# Patient Record
Sex: Male | Born: 1940
Health system: Southern US, Community
[De-identification: ages and names within clinical notes are randomized; demographics above are authoritative.]

## PROBLEM LIST (undated history)

## (undated) DIAGNOSIS — I10 Essential (primary) hypertension: Secondary | ICD-10-CM

## (undated) DIAGNOSIS — R972 Elevated prostate specific antigen [PSA]: Secondary | ICD-10-CM

## (undated) DIAGNOSIS — I499 Cardiac arrhythmia, unspecified: Secondary | ICD-10-CM

## (undated) DIAGNOSIS — E785 Hyperlipidemia, unspecified: Secondary | ICD-10-CM

## (undated) DIAGNOSIS — T782XXA Anaphylactic shock, unspecified, initial encounter: Secondary | ICD-10-CM

## (undated) DIAGNOSIS — R079 Chest pain, unspecified: Secondary | ICD-10-CM

## (undated) DIAGNOSIS — M1990 Unspecified osteoarthritis, unspecified site: Secondary | ICD-10-CM

## (undated) HISTORY — DX: Hyperlipidemia, unspecified: E78.5

## (undated) HISTORY — DX: Chest pain, unspecified: R07.9

## (undated) HISTORY — PX: TONSILLECTOMY: SUR1361

## (undated) HISTORY — DX: Elevated prostate specific antigen (PSA): R97.20

## (undated) HISTORY — PX: LAPAROSCOPIC CHOLECYSTECTOMY: SUR755

## (undated) HISTORY — DX: Anaphylactic shock, unspecified, initial encounter: T78.2XXA

---

## 2003-06-17 ENCOUNTER — Ambulatory Visit (HOSPITAL_COMMUNITY): Admission: RE | Admit: 2003-06-17 | Discharge: 2003-06-17 | Payer: Self-pay | Admitting: Gastroenterology

## 2003-06-17 ENCOUNTER — Encounter (INDEPENDENT_AMBULATORY_CARE_PROVIDER_SITE_OTHER): Payer: Self-pay

## 2004-07-27 ENCOUNTER — Ambulatory Visit (HOSPITAL_COMMUNITY): Admission: RE | Admit: 2004-07-27 | Discharge: 2004-07-27 | Payer: Self-pay | Admitting: Gastroenterology

## 2004-07-27 ENCOUNTER — Encounter (INDEPENDENT_AMBULATORY_CARE_PROVIDER_SITE_OTHER): Payer: Self-pay | Admitting: *Deleted

## 2009-02-03 ENCOUNTER — Encounter: Admission: RE | Admit: 2009-02-03 | Discharge: 2009-02-03 | Payer: Self-pay | Admitting: Surgery

## 2009-04-08 ENCOUNTER — Ambulatory Visit (HOSPITAL_COMMUNITY): Admission: RE | Admit: 2009-04-08 | Discharge: 2009-04-09 | Payer: Self-pay | Admitting: Surgery

## 2009-04-08 ENCOUNTER — Encounter (INDEPENDENT_AMBULATORY_CARE_PROVIDER_SITE_OTHER): Payer: Self-pay | Admitting: Surgery

## 2010-09-24 ENCOUNTER — Other Ambulatory Visit: Payer: Self-pay | Admitting: Dermatology

## 2010-12-04 LAB — BASIC METABOLIC PANEL
BUN: 11 mg/dL (ref 6–23)
Chloride: 103 mEq/L (ref 96–112)
GFR calc Af Amer: >60 mL/min (ref >60)
Potassium: 5.1 mEq/L (ref 3.5–5.1)

## 2010-12-04 LAB — HEMOGLOBIN AND HEMATOCRIT, BLOOD
HCT: 48.5 % (ref 39.0–52.0)
Hemoglobin: 16.2 g/dL (ref 13.0–17.0)

## 2011-01-11 NOTE — Op Note (Signed)
NAMEEZRAH, PANNING              ACCOUNT NO.:  [id number]   MEDICAL RECORD NO.:  [id number]          PATIENT TYPE:  AMB   LOCATION:  DAY                          FACILITY:  [facility]   PHYSICIAN:  Thornton Park. Daphine Deutscher, MD  DATE OF BIRTH:  [birth year]   DATE OF PROCEDURE:  DATE OF DISCHARGE:                               OPERATIVE REPORT   PROCEDURE:  Laparoscopic cholecystectomy, IOC.   SURGEON: Matt B. Daphine Deutscher, MD, FACS   ASSISTANT:  Currie Paris, M.D.   DIAGNOSIS:  Probable polyp of the fundus of the gallbladder   PROCEDURE:  Mr. Smalls was taken to room 1, given general anesthesia.  The abdomen was prepped with a Techni-Care equivalent, draped sterilely.  I entered the abdomen through the right upper quadrant using 5-mm 0-  degree OptiView without difficulty.  The abdomen was insufflated.  Two  more 5 mm trocarswere used inferiorly and 10 mm in the upper midline.   The gallbladder was grasped, elevated.  I dissected free Calot's  triangle and got a critical view.  I clipped the artery with set least  two clips, put a clip upon the gallbladder, incised it, and did a  dynamic cholangiogram which showed good intrahepatic filling and free  flow into the duodenum.  No stones were seen.  Cystic duct was then  triple clipped, divided, and the gallbladder was removed the gallbladder  bed without difficulty, placed in a bag and brought out through the  upper midline trocar.  Wounds were injected with Marcaine.  The upper  midline fascia was closed with 0 Vicryl and the wounds were closed 4-0  Vicryl, Benzoin, Steri-Strips.  The patient tolerated procedure well,  was taken to recovery room in satisfactory condition.      Thornton Park Daphine Deutscher, MD  Electronically Signed     MBM/MEDQ  D:  04/08/2009  T:  04/08/2009  Job:  [id number]   cc:   Windy Fast L. Earlene Plater, M.D.  Fax: [phone]

## 2011-01-14 NOTE — Op Note (Signed)
Todd Nguyen, Todd Nguyen              ACCOUNT NO.:  [id number]   MEDICAL RECORD NO.:  [id number]          PATIENT TYPE:  AMB   LOCATION:  ENDO                         FACILITY:  [facility]   PHYSICIAN:  James L. Malon Kindle., M.D.DATE OF BIRTH:  [birth year]   DATE OF PROCEDURE:  07/27/2004  DATE OF DISCHARGE:                                 OPERATIVE REPORT   PROCEDURE:  Colonoscopy and polypectomy.   SURGEON:   MEDICATIONS:  Fentanyl 100 mcg and Versed 10 mg IV.   INDICATIONS FOR PROCEDURE:  The patient with multiple large colon polyps  removed a year ago.  This is done as a one year follow-up.   DESCRIPTION OF PROCEDURE:  The procedure had been explained to the patient  and consent obtained.  In the left lateral decubitus position, the Olympus  scope was inserted and advanced. The prep was excellent. We were able to  reach the cecum using abdominal pressure and position changes.  The cecum  was normal. The appendiceal orifice was seen.  Just above the cecum in the  ascending colon, a 0.5 cm polyp was snared and sucked through the scope. The  remainder of the ascending colon and transverse colon was normal. In the  descending colon, a 3 mm polyp was cauterized with a hot biopsy forceps. No  polyps were seen at the sigmoid colon.  A few scattered diverticula in the  rectum and a 3 mm polyp was cauterized and placed in jar #2.  The scope was  withdrawn.  The patient tolerated the procedure well.   ASSESSMENT:  Multiple colon polyps removed, 211.3.   PLAN:  Routine post polypectomy instructions.  Will recommend repeating  procedure in three years.       JLE/MEDQ  D:  07/27/2004  T:  07/27/2004  Job:  [id number]   cc:   Caryn Bee L. Little, M.D.  [address]  [place]  [place] [address]  Fax: [phone]

## 2011-01-14 NOTE — Op Note (Signed)
   NAME:  Todd Nguyen, Todd Nguyen                        ACCOUNT NO.:  [id number]   MEDICAL RECORD NO.:  [id number]                   PATIENT TYPE:  AMB   LOCATION:  ENDO                                 FACILITY:  [facility]   PHYSICIAN:  James L. Malon Kindle., M.D.          DATE OF BIRTH:  1940/12/26   DATE OF PROCEDURE:  06/17/2003  DATE OF DISCHARGE:                                 OPERATIVE REPORT   PROCEDURE:  Colonoscopy and polypectomy.   MEDICATIONS:  Fentanyl 50 mcg, Versed 10 mg IV.   SCOPE:  Olympus adjustable colonoscope.   INDICATIONS FOR PROCEDURE:  Colon cancer screening.   DESCRIPTION OF PROCEDURE:  The procedure had been explained to the patient  and consent obtained. With the patient in the left lateral decubitus  position, the Olympus pediatric adjustable scope was inserted and advanced.  The patient had a long tortuous colon with position changes and placing the  patient in the supine position and abdominal pressure, we were able to  advance to the cecum. The ileocecal valve and appendiceal orifice were seen.  The scope was withdrawn and the cecum, ascending colon carefully examined  and were normal. In the vicinity of the hepatic flexure, two polyps were  encountered each approximately 1.5 cm and were sessile, flat, draped over a  fold. Each was removed in several pieces and sucked through the scope. In  addition to these polyps which were very close together, two other polyps  each 3-4 mm were found and were hot biopsied approximately 5 cm distal to  what I believe was the transverse colon. There was no significant bleeding  in any of the polypectomy sites. The scope was withdrawn and the remainder  of the transverse colon, descending and sigmoid colon were seen well and  were free of polyps. There was no significant diverticulosis. The scope was  withdrawn.  The patient tolerated the procedure well and was maintained on  low flow oxygen and pulse oximeter throughout the  procedure.   ASSESSMENT:  Multiple polyps removed in the vicinity of the hepatic flexure  and proximal transverse colon.   PLAN:  Will check path results, routine post polypectomy instructions and  will recommend repeating in one year.                                               James L. Malon Kindle., M.D.    Waldron Session  D:  06/17/2003  T:  06/17/2003  Job:  [id number]   cc:   Caryn Bee L. Little, M.D.  [address]  [place]  [place] [address]  Fax: [phone]

## 2011-01-17 ENCOUNTER — Other Ambulatory Visit: Payer: Self-pay | Admitting: Dermatology

## 2011-06-05 IMAGING — US US ABDOMEN COMPLETE
1 series · 13 of 25 positions shown · non-contrast
Comparison: CT abdomen pelvis from [REDACTED] dated
12/29/2008

CLINICAL DATA: Question of gallstone on CT at [REDACTED].

COMPLETE ABDOMINAL ULTRASOUND

[Series 1: us abdomen complete · 0.34mm/px · 13 of 109 slices shown]
[im 1/109]
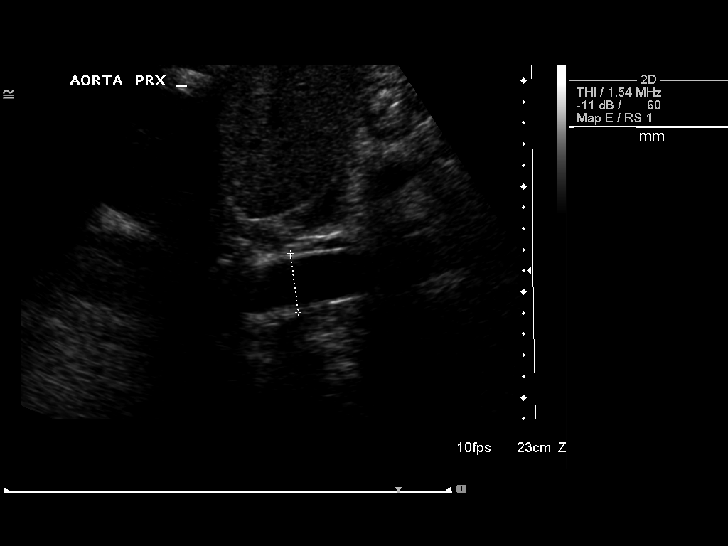
[im 10/109]
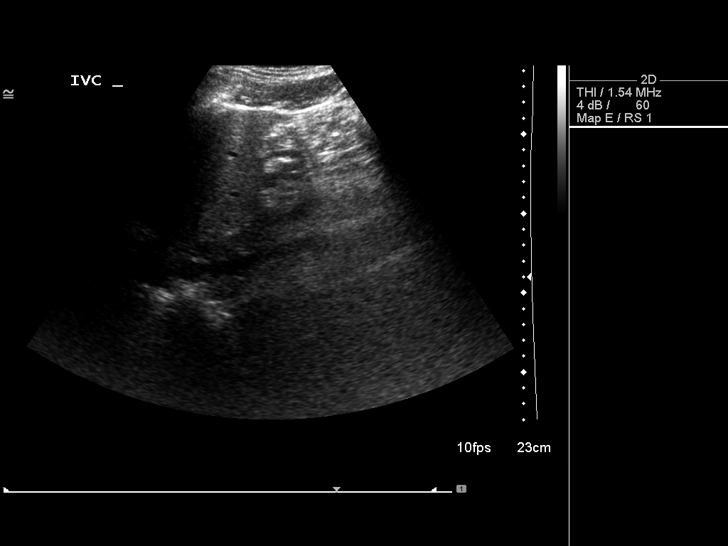
[im 19/109]
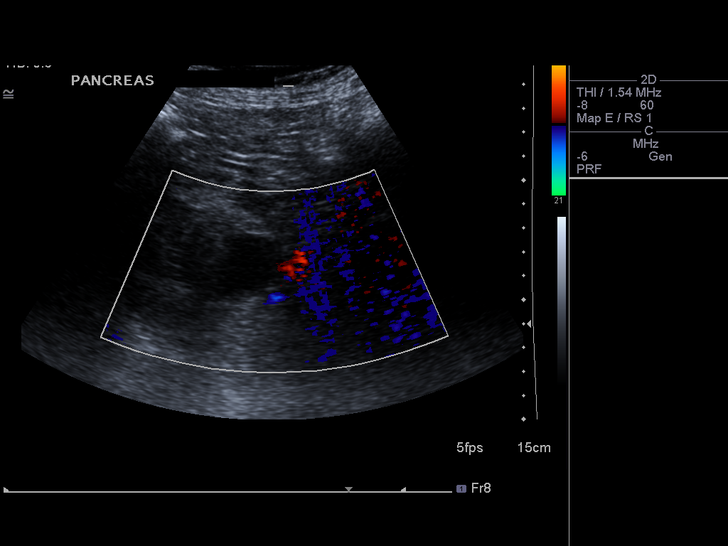
[im 28/109]
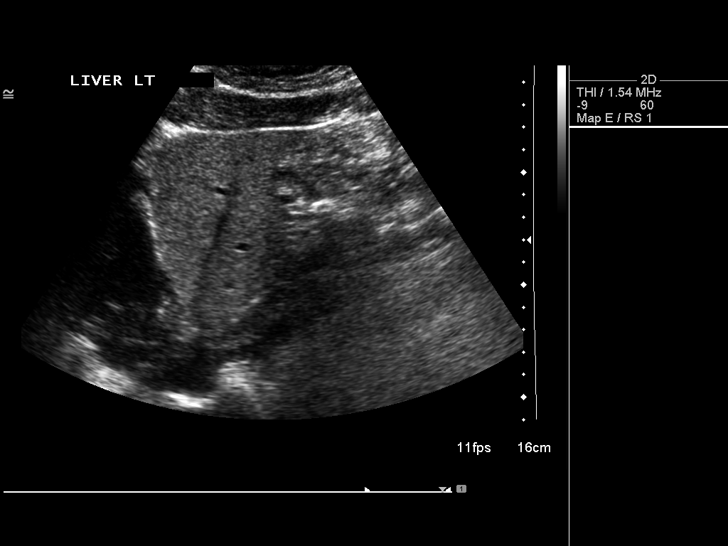
[im 37/109]
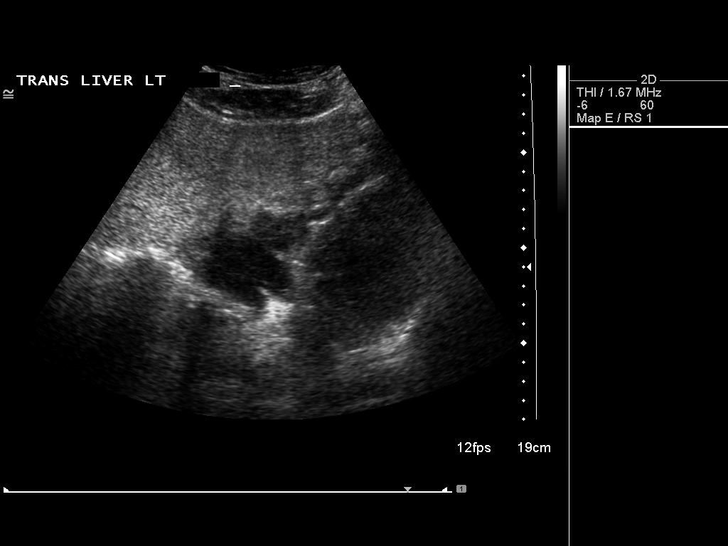
[im 46/109]
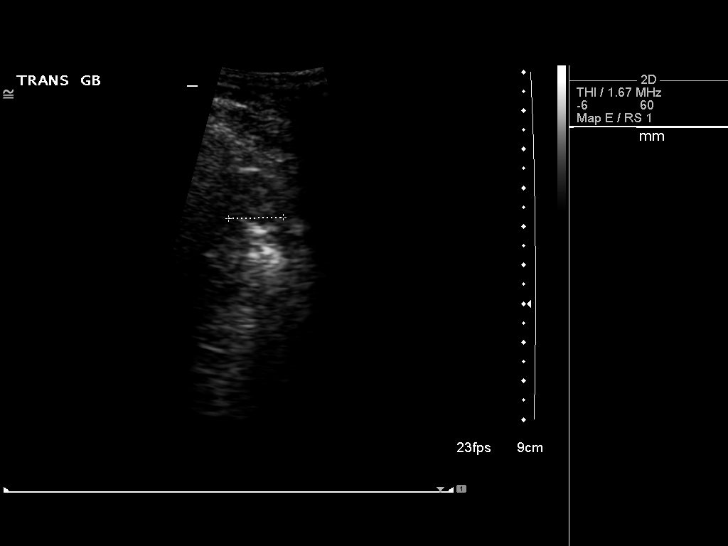
[im 55/109]
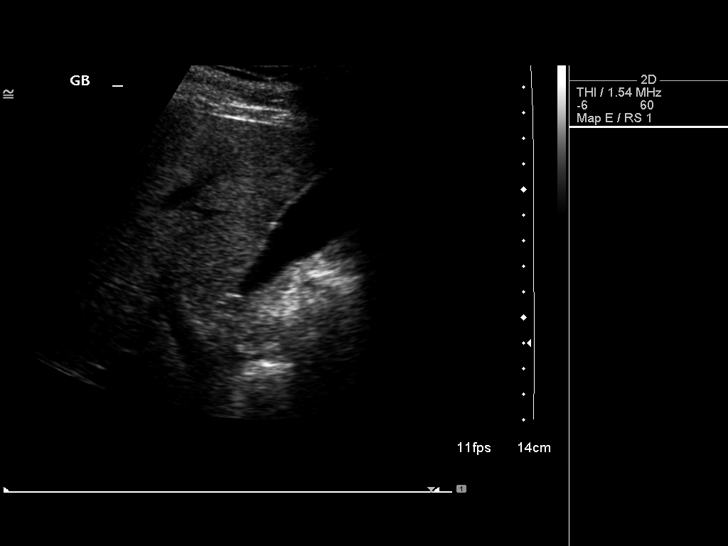
[im 64/109]
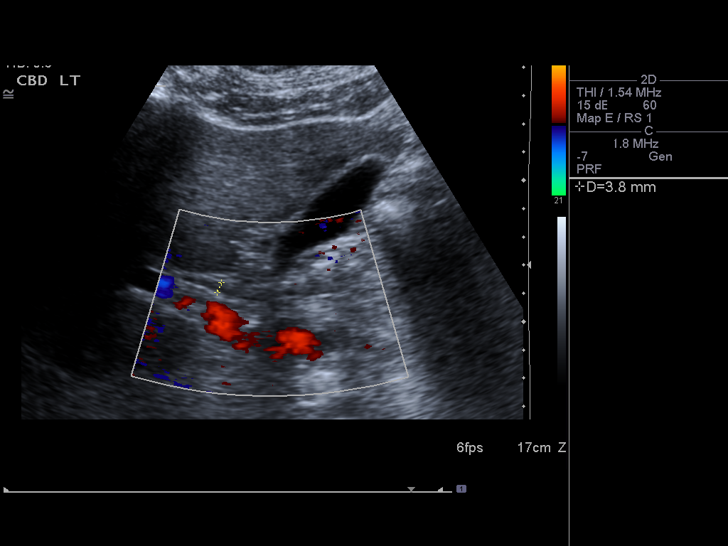
[im 73/109]
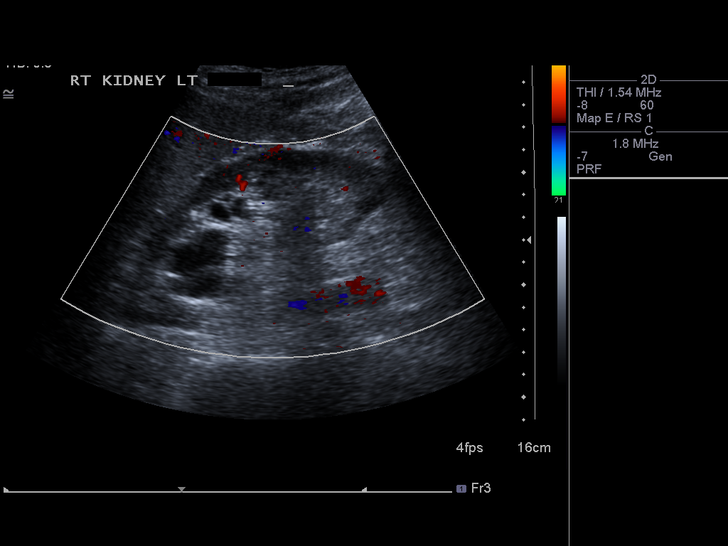
[im 82/109]
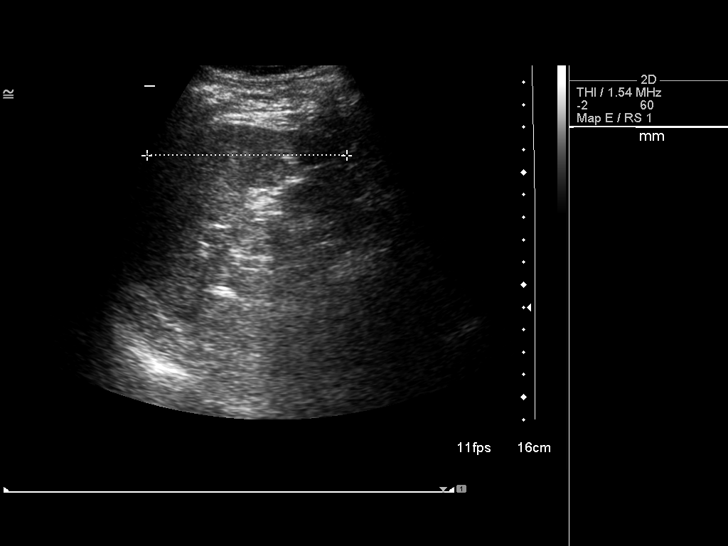
[im 91/109]
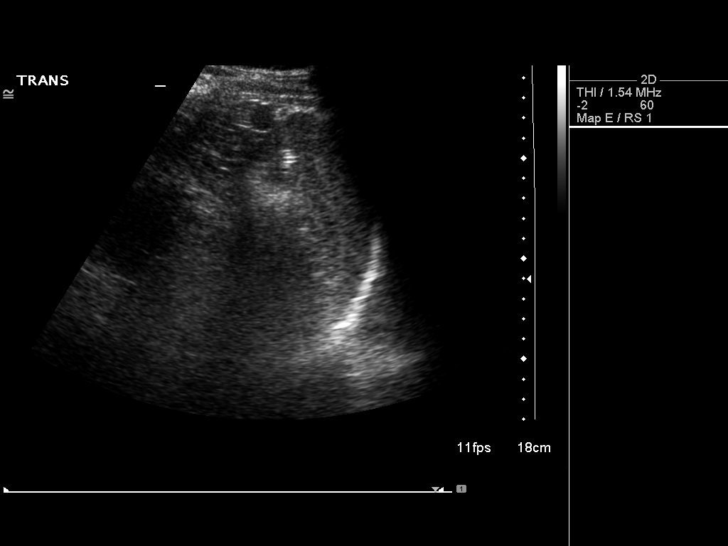
[im 100/109]
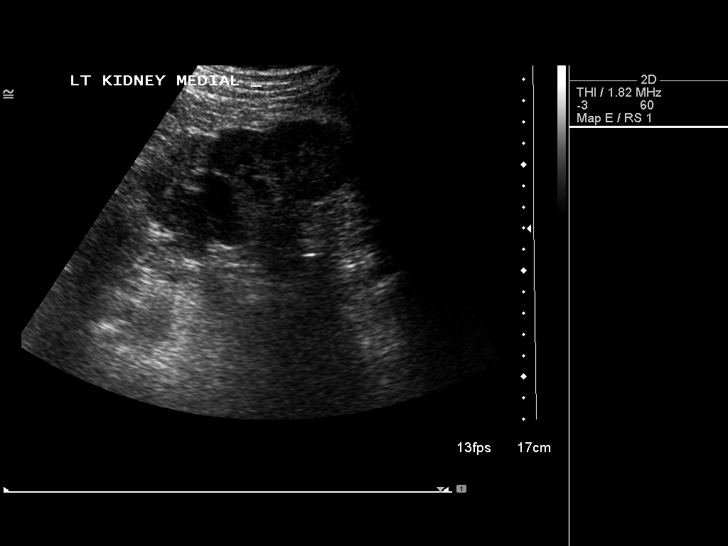
[im 109/109]
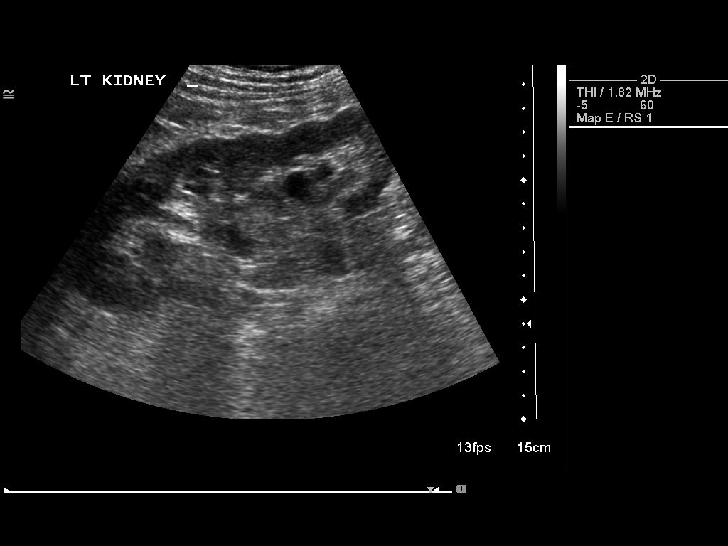

[13 of 25 positions shown; findings below may reference images not displayed]

FINDINGS: Gallbladder:  As noted on CT, there is soft tissue lesion within
the fundus of the gallbladder that does not change position.  The
primary considerations are that of a gallbladder polyp, gallbladder
focal adenomyomatosis, or possibly gallbladder carcinoma.  This
soft tissue mass measures 1.9 x 1.1 x 1.4 cm.  No definite
gallstones are seen.

Common bile duct:  The common bile duct is normal measuring 3.8 mm
in diameter.

Liver:  The liver is inhomogeneous and echogenic consistent with
fatty infiltration.  No ductal dilatation is seen.

IVC:  Visualized.

Pancreas:  The pancreas appears normal although there is a cyst in
the body of the pancreas measuring 2.5 x 2.7 x 2.6 cm, which is
well seen on the [REDACTED] CT of the abdomen pelvis.

Spleen:  The spleen is normal in size with a small splenule noted.

Right Kidney:  No hydronephrosis is seen.  The right kidney
measures 13.9 cm.  Multiple parapelvic cysts are noted.

Left Kidney:  No hydronephrosis.  The left kidney measures 15.9 cm.
Multiple parapelvic cysts are noted.

Abdominal aorta:  Atheromatous change of the abdominal aorta is
noted.
IMPRESSION: 1.  1.9 x 1.1 x 1.4 cm solid mass in the fundus of the gallbladder.
Considerations are that of gallbladder polyp, focal
adenomyomatosis, or gallbladder carcinoma.
2.  Fatty infiltration of the liver.
3.  Cystic lesion in the mid body of the pancreas.
4.  Multiple parapelvic renal cysts.

## 2011-07-25 ENCOUNTER — Other Ambulatory Visit: Payer: Self-pay | Admitting: Dermatology

## 2011-09-15 DIAGNOSIS — M79609 Pain in unspecified limb: Secondary | ICD-10-CM | POA: Diagnosis not present

## 2011-09-23 DIAGNOSIS — L821 Other seborrheic keratosis: Secondary | ICD-10-CM | POA: Diagnosis not present

## 2011-09-23 DIAGNOSIS — L578 Other skin changes due to chronic exposure to nonionizing radiation: Secondary | ICD-10-CM | POA: Diagnosis not present

## 2011-12-12 DIAGNOSIS — R972 Elevated prostate specific antigen [PSA]: Secondary | ICD-10-CM | POA: Diagnosis not present

## 2012-01-09 DIAGNOSIS — C61 Malignant neoplasm of prostate: Secondary | ICD-10-CM | POA: Diagnosis not present

## 2012-06-04 DIAGNOSIS — N320 Bladder-neck obstruction: Secondary | ICD-10-CM | POA: Diagnosis not present

## 2012-06-04 DIAGNOSIS — Z23 Encounter for immunization: Secondary | ICD-10-CM | POA: Diagnosis not present

## 2012-06-04 DIAGNOSIS — N400 Enlarged prostate without lower urinary tract symptoms: Secondary | ICD-10-CM | POA: Diagnosis not present

## 2012-06-04 DIAGNOSIS — R972 Elevated prostate specific antigen [PSA]: Secondary | ICD-10-CM | POA: Diagnosis not present

## 2012-06-15 DIAGNOSIS — R42 Dizziness and giddiness: Secondary | ICD-10-CM | POA: Diagnosis not present

## 2012-06-15 DIAGNOSIS — Z79899 Other long term (current) drug therapy: Secondary | ICD-10-CM | POA: Diagnosis not present

## 2012-06-15 DIAGNOSIS — E782 Mixed hyperlipidemia: Secondary | ICD-10-CM | POA: Diagnosis not present

## 2012-06-15 DIAGNOSIS — R7301 Impaired fasting glucose: Secondary | ICD-10-CM | POA: Diagnosis not present

## 2012-06-15 DIAGNOSIS — Z0000 Encounter for general adult medical examination without abnormal findings: Secondary | ICD-10-CM | POA: Diagnosis not present

## 2012-06-15 DIAGNOSIS — L408 Other psoriasis: Secondary | ICD-10-CM | POA: Diagnosis not present

## 2012-06-15 DIAGNOSIS — Z1331 Encounter for screening for depression: Secondary | ICD-10-CM | POA: Diagnosis not present

## 2012-06-15 DIAGNOSIS — Z86010 Personal history of colonic polyps: Secondary | ICD-10-CM | POA: Diagnosis not present

## 2012-06-15 DIAGNOSIS — J3089 Other allergic rhinitis: Secondary | ICD-10-CM | POA: Diagnosis not present

## 2012-06-15 DIAGNOSIS — R972 Elevated prostate specific antigen [PSA]: Secondary | ICD-10-CM | POA: Diagnosis not present

## 2012-07-18 DIAGNOSIS — R42 Dizziness and giddiness: Secondary | ICD-10-CM | POA: Diagnosis not present

## 2012-07-18 DIAGNOSIS — E7800 Pure hypercholesterolemia, unspecified: Secondary | ICD-10-CM | POA: Diagnosis not present

## 2012-07-18 DIAGNOSIS — R972 Elevated prostate specific antigen [PSA]: Secondary | ICD-10-CM | POA: Diagnosis not present

## 2012-09-24 DIAGNOSIS — L919 Hypertrophic disorder of the skin, unspecified: Secondary | ICD-10-CM | POA: Diagnosis not present

## 2012-09-24 DIAGNOSIS — Z85828 Personal history of other malignant neoplasm of skin: Secondary | ICD-10-CM | POA: Diagnosis not present

## 2012-09-24 DIAGNOSIS — L578 Other skin changes due to chronic exposure to nonionizing radiation: Secondary | ICD-10-CM | POA: Diagnosis not present

## 2012-09-24 DIAGNOSIS — L819 Disorder of pigmentation, unspecified: Secondary | ICD-10-CM | POA: Diagnosis not present

## 2012-09-24 DIAGNOSIS — L909 Atrophic disorder of skin, unspecified: Secondary | ICD-10-CM | POA: Diagnosis not present

## 2012-09-24 DIAGNOSIS — D1801 Hemangioma of skin and subcutaneous tissue: Secondary | ICD-10-CM | POA: Diagnosis not present

## 2012-10-30 DIAGNOSIS — K648 Other hemorrhoids: Secondary | ICD-10-CM | POA: Diagnosis not present

## 2012-10-30 DIAGNOSIS — Z86010 Personal history of colonic polyps: Secondary | ICD-10-CM | POA: Diagnosis not present

## 2012-10-30 DIAGNOSIS — Z09 Encounter for follow-up examination after completed treatment for conditions other than malignant neoplasm: Secondary | ICD-10-CM | POA: Diagnosis not present

## 2012-11-16 DIAGNOSIS — H2510 Age-related nuclear cataract, unspecified eye: Secondary | ICD-10-CM | POA: Diagnosis not present

## 2012-12-17 DIAGNOSIS — N400 Enlarged prostate without lower urinary tract symptoms: Secondary | ICD-10-CM | POA: Diagnosis not present

## 2012-12-17 DIAGNOSIS — N320 Bladder-neck obstruction: Secondary | ICD-10-CM | POA: Diagnosis not present

## 2012-12-17 DIAGNOSIS — R972 Elevated prostate specific antigen [PSA]: Secondary | ICD-10-CM | POA: Diagnosis not present

## 2013-05-22 DIAGNOSIS — J019 Acute sinusitis, unspecified: Secondary | ICD-10-CM | POA: Diagnosis not present

## 2013-05-29 DIAGNOSIS — R05 Cough: Secondary | ICD-10-CM | POA: Diagnosis not present

## 2013-06-20 DIAGNOSIS — Z23 Encounter for immunization: Secondary | ICD-10-CM | POA: Diagnosis not present

## 2013-06-20 DIAGNOSIS — R972 Elevated prostate specific antigen [PSA]: Secondary | ICD-10-CM | POA: Diagnosis not present

## 2013-06-20 DIAGNOSIS — E7800 Pure hypercholesterolemia, unspecified: Secondary | ICD-10-CM | POA: Diagnosis not present

## 2013-06-20 DIAGNOSIS — Z79899 Other long term (current) drug therapy: Secondary | ICD-10-CM | POA: Diagnosis not present

## 2013-06-20 DIAGNOSIS — N400 Enlarged prostate without lower urinary tract symptoms: Secondary | ICD-10-CM | POA: Diagnosis not present

## 2013-06-21 DIAGNOSIS — Z79899 Other long term (current) drug therapy: Secondary | ICD-10-CM | POA: Diagnosis not present

## 2013-06-21 DIAGNOSIS — E782 Mixed hyperlipidemia: Secondary | ICD-10-CM | POA: Diagnosis not present

## 2013-06-21 DIAGNOSIS — Z1331 Encounter for screening for depression: Secondary | ICD-10-CM | POA: Diagnosis not present

## 2013-06-21 DIAGNOSIS — L408 Other psoriasis: Secondary | ICD-10-CM | POA: Diagnosis not present

## 2013-06-21 DIAGNOSIS — Z86010 Personal history of colonic polyps: Secondary | ICD-10-CM | POA: Diagnosis not present

## 2013-06-21 DIAGNOSIS — R972 Elevated prostate specific antigen [PSA]: Secondary | ICD-10-CM | POA: Diagnosis not present

## 2013-06-21 DIAGNOSIS — R7301 Impaired fasting glucose: Secondary | ICD-10-CM | POA: Diagnosis not present

## 2013-06-21 DIAGNOSIS — Z0000 Encounter for general adult medical examination without abnormal findings: Secondary | ICD-10-CM | POA: Diagnosis not present

## 2013-07-01 DIAGNOSIS — R972 Elevated prostate specific antigen [PSA]: Secondary | ICD-10-CM | POA: Diagnosis not present

## 2013-07-01 DIAGNOSIS — N320 Bladder-neck obstruction: Secondary | ICD-10-CM | POA: Diagnosis not present

## 2013-07-01 DIAGNOSIS — N400 Enlarged prostate without lower urinary tract symptoms: Secondary | ICD-10-CM | POA: Diagnosis not present

## 2013-09-03 ENCOUNTER — Other Ambulatory Visit: Payer: Self-pay | Admitting: Dermatology

## 2013-09-03 DIAGNOSIS — L819 Disorder of pigmentation, unspecified: Secondary | ICD-10-CM | POA: Diagnosis not present

## 2013-09-03 DIAGNOSIS — D1801 Hemangioma of skin and subcutaneous tissue: Secondary | ICD-10-CM | POA: Diagnosis not present

## 2013-09-03 DIAGNOSIS — Z85828 Personal history of other malignant neoplasm of skin: Secondary | ICD-10-CM | POA: Diagnosis not present

## 2013-09-03 DIAGNOSIS — L408 Other psoriasis: Secondary | ICD-10-CM | POA: Diagnosis not present

## 2013-09-03 DIAGNOSIS — D485 Neoplasm of uncertain behavior of skin: Secondary | ICD-10-CM | POA: Diagnosis not present

## 2013-09-03 DIAGNOSIS — L570 Actinic keratosis: Secondary | ICD-10-CM | POA: Diagnosis not present

## 2013-11-15 DIAGNOSIS — H2510 Age-related nuclear cataract, unspecified eye: Secondary | ICD-10-CM | POA: Diagnosis not present

## 2013-12-12 ENCOUNTER — Encounter: Payer: Self-pay | Admitting: *Deleted

## 2014-06-09 DIAGNOSIS — L400 Psoriasis vulgaris: Secondary | ICD-10-CM | POA: Diagnosis not present

## 2014-06-09 DIAGNOSIS — D1801 Hemangioma of skin and subcutaneous tissue: Secondary | ICD-10-CM | POA: Diagnosis not present

## 2014-06-09 DIAGNOSIS — Z85828 Personal history of other malignant neoplasm of skin: Secondary | ICD-10-CM | POA: Diagnosis not present

## 2014-06-09 DIAGNOSIS — L570 Actinic keratosis: Secondary | ICD-10-CM | POA: Diagnosis not present

## 2014-06-23 DIAGNOSIS — R972 Elevated prostate specific antigen [PSA]: Secondary | ICD-10-CM | POA: Diagnosis not present

## 2014-06-23 DIAGNOSIS — N401 Enlarged prostate with lower urinary tract symptoms: Secondary | ICD-10-CM | POA: Diagnosis not present

## 2014-06-26 DIAGNOSIS — L409 Psoriasis, unspecified: Secondary | ICD-10-CM | POA: Diagnosis not present

## 2014-06-26 DIAGNOSIS — R7301 Impaired fasting glucose: Secondary | ICD-10-CM | POA: Diagnosis not present

## 2014-06-26 DIAGNOSIS — E782 Mixed hyperlipidemia: Secondary | ICD-10-CM | POA: Diagnosis not present

## 2014-06-26 DIAGNOSIS — Z79899 Other long term (current) drug therapy: Secondary | ICD-10-CM | POA: Diagnosis not present

## 2014-06-26 DIAGNOSIS — Z0000 Encounter for general adult medical examination without abnormal findings: Secondary | ICD-10-CM | POA: Diagnosis not present

## 2014-06-26 DIAGNOSIS — J3089 Other allergic rhinitis: Secondary | ICD-10-CM | POA: Diagnosis not present

## 2014-06-26 DIAGNOSIS — M8580 Other specified disorders of bone density and structure, unspecified site: Secondary | ICD-10-CM | POA: Diagnosis not present

## 2014-06-26 DIAGNOSIS — R972 Elevated prostate specific antigen [PSA]: Secondary | ICD-10-CM | POA: Diagnosis not present

## 2014-06-26 DIAGNOSIS — Z86010 Personal history of colonic polyps: Secondary | ICD-10-CM | POA: Diagnosis not present

## 2014-07-22 DIAGNOSIS — Z23 Encounter for immunization: Secondary | ICD-10-CM | POA: Diagnosis not present

## 2014-08-29 HISTORY — PX: SPLENECTOMY: SUR1306

## 2014-11-10 DIAGNOSIS — Z23 Encounter for immunization: Secondary | ICD-10-CM | POA: Diagnosis not present

## 2014-11-20 DIAGNOSIS — H2513 Age-related nuclear cataract, bilateral: Secondary | ICD-10-CM | POA: Diagnosis not present

## 2014-12-01 DIAGNOSIS — D485 Neoplasm of uncertain behavior of skin: Secondary | ICD-10-CM | POA: Diagnosis not present

## 2014-12-01 DIAGNOSIS — Z85828 Personal history of other malignant neoplasm of skin: Secondary | ICD-10-CM | POA: Diagnosis not present

## 2014-12-24 DIAGNOSIS — J209 Acute bronchitis, unspecified: Secondary | ICD-10-CM | POA: Diagnosis not present

## 2014-12-24 DIAGNOSIS — J029 Acute pharyngitis, unspecified: Secondary | ICD-10-CM | POA: Diagnosis not present

## 2014-12-30 DIAGNOSIS — J189 Pneumonia, unspecified organism: Secondary | ICD-10-CM | POA: Diagnosis not present

## 2015-01-02 DIAGNOSIS — R05 Cough: Secondary | ICD-10-CM | POA: Diagnosis not present

## 2015-01-20 DIAGNOSIS — L814 Other melanin hyperpigmentation: Secondary | ICD-10-CM | POA: Diagnosis not present

## 2015-01-20 DIAGNOSIS — L400 Psoriasis vulgaris: Secondary | ICD-10-CM | POA: Diagnosis not present

## 2015-01-20 DIAGNOSIS — D485 Neoplasm of uncertain behavior of skin: Secondary | ICD-10-CM | POA: Diagnosis not present

## 2015-01-20 DIAGNOSIS — L570 Actinic keratosis: Secondary | ICD-10-CM | POA: Diagnosis not present

## 2015-01-20 DIAGNOSIS — Z85828 Personal history of other malignant neoplasm of skin: Secondary | ICD-10-CM | POA: Diagnosis not present

## 2015-01-20 DIAGNOSIS — D1801 Hemangioma of skin and subcutaneous tissue: Secondary | ICD-10-CM | POA: Diagnosis not present

## 2015-01-20 DIAGNOSIS — C44719 Basal cell carcinoma of skin of left lower limb, including hip: Secondary | ICD-10-CM | POA: Diagnosis not present

## 2015-01-20 DIAGNOSIS — L853 Xerosis cutis: Secondary | ICD-10-CM | POA: Diagnosis not present

## 2015-04-27 DIAGNOSIS — R319 Hematuria, unspecified: Secondary | ICD-10-CM | POA: Diagnosis not present

## 2015-04-27 DIAGNOSIS — N403 Nodular prostate with lower urinary tract symptoms: Secondary | ICD-10-CM | POA: Diagnosis not present

## 2015-05-05 DIAGNOSIS — K869 Disease of pancreas, unspecified: Secondary | ICD-10-CM | POA: Diagnosis not present

## 2015-05-05 DIAGNOSIS — N289 Disorder of kidney and ureter, unspecified: Secondary | ICD-10-CM | POA: Diagnosis not present

## 2015-05-05 DIAGNOSIS — R319 Hematuria, unspecified: Secondary | ICD-10-CM | POA: Diagnosis not present

## 2015-05-27 DIAGNOSIS — N400 Enlarged prostate without lower urinary tract symptoms: Secondary | ICD-10-CM | POA: Diagnosis not present

## 2015-05-27 DIAGNOSIS — I499 Cardiac arrhythmia, unspecified: Secondary | ICD-10-CM | POA: Diagnosis not present

## 2015-05-27 DIAGNOSIS — I493 Ventricular premature depolarization: Secondary | ICD-10-CM | POA: Diagnosis not present

## 2015-05-27 DIAGNOSIS — Z87891 Personal history of nicotine dependence: Secondary | ICD-10-CM | POA: Diagnosis not present

## 2015-05-27 DIAGNOSIS — R319 Hematuria, unspecified: Secondary | ICD-10-CM | POA: Diagnosis not present

## 2015-05-27 DIAGNOSIS — N401 Enlarged prostate with lower urinary tract symptoms: Secondary | ICD-10-CM | POA: Diagnosis not present

## 2015-05-27 DIAGNOSIS — K862 Cyst of pancreas: Secondary | ICD-10-CM | POA: Diagnosis not present

## 2015-05-27 DIAGNOSIS — R970 Elevated carcinoembryonic antigen [CEA]: Secondary | ICD-10-CM | POA: Diagnosis not present

## 2015-06-12 DIAGNOSIS — K862 Cyst of pancreas: Secondary | ICD-10-CM | POA: Diagnosis not present

## 2015-06-19 DIAGNOSIS — Q8901 Asplenia (congenital): Secondary | ICD-10-CM | POA: Diagnosis not present

## 2015-06-19 DIAGNOSIS — K862 Cyst of pancreas: Secondary | ICD-10-CM | POA: Diagnosis not present

## 2015-06-19 DIAGNOSIS — R001 Bradycardia, unspecified: Secondary | ICD-10-CM | POA: Diagnosis not present

## 2015-06-19 DIAGNOSIS — Z23 Encounter for immunization: Secondary | ICD-10-CM | POA: Diagnosis not present

## 2015-06-22 DIAGNOSIS — N281 Cyst of kidney, acquired: Secondary | ICD-10-CM | POA: Diagnosis not present

## 2015-06-22 DIAGNOSIS — N401 Enlarged prostate with lower urinary tract symptoms: Secondary | ICD-10-CM | POA: Diagnosis not present

## 2015-06-22 DIAGNOSIS — R972 Elevated prostate specific antigen [PSA]: Secondary | ICD-10-CM | POA: Diagnosis not present

## 2015-07-02 DIAGNOSIS — R0602 Shortness of breath: Secondary | ICD-10-CM | POA: Diagnosis not present

## 2015-07-02 DIAGNOSIS — I10 Essential (primary) hypertension: Secondary | ICD-10-CM | POA: Diagnosis not present

## 2015-07-02 DIAGNOSIS — K862 Cyst of pancreas: Secondary | ICD-10-CM | POA: Diagnosis not present

## 2015-07-02 DIAGNOSIS — R001 Bradycardia, unspecified: Secondary | ICD-10-CM | POA: Diagnosis not present

## 2015-07-02 DIAGNOSIS — Z87891 Personal history of nicotine dependence: Secondary | ICD-10-CM | POA: Diagnosis not present

## 2015-07-02 DIAGNOSIS — I071 Rheumatic tricuspid insufficiency: Secondary | ICD-10-CM | POA: Diagnosis not present

## 2015-07-02 DIAGNOSIS — I493 Ventricular premature depolarization: Secondary | ICD-10-CM | POA: Diagnosis not present

## 2015-07-06 DIAGNOSIS — R0602 Shortness of breath: Secondary | ICD-10-CM | POA: Diagnosis not present

## 2015-07-06 DIAGNOSIS — R079 Chest pain, unspecified: Secondary | ICD-10-CM | POA: Diagnosis not present

## 2015-07-06 DIAGNOSIS — I429 Cardiomyopathy, unspecified: Secondary | ICD-10-CM | POA: Diagnosis not present

## 2015-07-06 DIAGNOSIS — I517 Cardiomegaly: Secondary | ICD-10-CM | POA: Diagnosis not present

## 2015-07-06 DIAGNOSIS — I10 Essential (primary) hypertension: Secondary | ICD-10-CM | POA: Diagnosis not present

## 2015-07-07 DIAGNOSIS — I493 Ventricular premature depolarization: Secondary | ICD-10-CM | POA: Diagnosis not present

## 2015-07-08 DIAGNOSIS — Z87891 Personal history of nicotine dependence: Secondary | ICD-10-CM | POA: Diagnosis not present

## 2015-07-08 DIAGNOSIS — N400 Enlarged prostate without lower urinary tract symptoms: Secondary | ICD-10-CM | POA: Diagnosis present

## 2015-07-08 DIAGNOSIS — Z23 Encounter for immunization: Secondary | ICD-10-CM | POA: Diagnosis not present

## 2015-07-08 DIAGNOSIS — I493 Ventricular premature depolarization: Secondary | ICD-10-CM | POA: Diagnosis not present

## 2015-07-08 DIAGNOSIS — I472 Ventricular tachycardia: Secondary | ICD-10-CM | POA: Diagnosis present

## 2015-07-08 DIAGNOSIS — Z7289 Other problems related to lifestyle: Secondary | ICD-10-CM | POA: Diagnosis not present

## 2015-07-08 DIAGNOSIS — K8689 Other specified diseases of pancreas: Secondary | ICD-10-CM | POA: Diagnosis present

## 2015-07-08 DIAGNOSIS — I361 Nonrheumatic tricuspid (valve) insufficiency: Secondary | ICD-10-CM | POA: Diagnosis not present

## 2015-07-08 DIAGNOSIS — Z79899 Other long term (current) drug therapy: Secondary | ICD-10-CM | POA: Diagnosis not present

## 2015-07-08 DIAGNOSIS — K869 Disease of pancreas, unspecified: Secondary | ICD-10-CM | POA: Diagnosis not present

## 2015-07-08 DIAGNOSIS — I272 Other secondary pulmonary hypertension: Secondary | ICD-10-CM | POA: Diagnosis not present

## 2015-07-08 DIAGNOSIS — Z9049 Acquired absence of other specified parts of digestive tract: Secondary | ICD-10-CM | POA: Diagnosis not present

## 2015-07-08 DIAGNOSIS — Z882 Allergy status to sulfonamides status: Secondary | ICD-10-CM | POA: Diagnosis not present

## 2015-07-09 DIAGNOSIS — K869 Disease of pancreas, unspecified: Secondary | ICD-10-CM | POA: Diagnosis not present

## 2015-07-09 DIAGNOSIS — I493 Ventricular premature depolarization: Secondary | ICD-10-CM | POA: Diagnosis not present

## 2015-07-09 DIAGNOSIS — I472 Ventricular tachycardia: Secondary | ICD-10-CM | POA: Diagnosis not present

## 2015-07-09 DIAGNOSIS — K8689 Other specified diseases of pancreas: Secondary | ICD-10-CM | POA: Diagnosis not present

## 2015-07-09 DIAGNOSIS — N400 Enlarged prostate without lower urinary tract symptoms: Secondary | ICD-10-CM | POA: Diagnosis not present

## 2015-07-09 DIAGNOSIS — Z9049 Acquired absence of other specified parts of digestive tract: Secondary | ICD-10-CM | POA: Diagnosis not present

## 2015-07-13 DIAGNOSIS — D136 Benign neoplasm of pancreas: Secondary | ICD-10-CM | POA: Diagnosis not present

## 2015-07-13 DIAGNOSIS — Z9889 Other specified postprocedural states: Secondary | ICD-10-CM | POA: Diagnosis not present

## 2015-07-13 DIAGNOSIS — K869 Disease of pancreas, unspecified: Secondary | ICD-10-CM | POA: Diagnosis not present

## 2015-07-13 DIAGNOSIS — K91841 Postprocedural hemorrhage and hematoma of a digestive system organ or structure following other procedure: Secondary | ICD-10-CM | POA: Diagnosis not present

## 2015-07-13 DIAGNOSIS — Z5331 Laparoscopic surgical procedure converted to open procedure: Secondary | ICD-10-CM | POA: Diagnosis not present

## 2015-07-13 DIAGNOSIS — Z87891 Personal history of nicotine dependence: Secondary | ICD-10-CM | POA: Diagnosis not present

## 2015-07-13 DIAGNOSIS — K567 Ileus, unspecified: Secondary | ICD-10-CM | POA: Diagnosis not present

## 2015-07-13 DIAGNOSIS — K9189 Other postprocedural complications and disorders of digestive system: Secondary | ICD-10-CM | POA: Diagnosis not present

## 2015-07-13 DIAGNOSIS — D7821 Postprocedural hemorrhage and hematoma of the spleen following a procedure on the spleen: Secondary | ICD-10-CM | POA: Diagnosis not present

## 2015-07-13 DIAGNOSIS — Z9049 Acquired absence of other specified parts of digestive tract: Secondary | ICD-10-CM | POA: Diagnosis not present

## 2015-07-13 DIAGNOSIS — I493 Ventricular premature depolarization: Secondary | ICD-10-CM | POA: Diagnosis not present

## 2015-07-13 DIAGNOSIS — D490 Neoplasm of unspecified behavior of digestive system: Secondary | ICD-10-CM | POA: Diagnosis not present

## 2015-07-13 DIAGNOSIS — M96841 Postprocedural hematoma of a musculoskeletal structure following other procedure: Secondary | ICD-10-CM | POA: Diagnosis not present

## 2015-07-13 DIAGNOSIS — R188 Other ascites: Secondary | ICD-10-CM | POA: Diagnosis not present

## 2015-07-13 DIAGNOSIS — R143 Flatulence: Secondary | ICD-10-CM | POA: Diagnosis not present

## 2015-07-13 DIAGNOSIS — Z882 Allergy status to sulfonamides status: Secondary | ICD-10-CM | POA: Diagnosis not present

## 2015-07-13 DIAGNOSIS — N400 Enlarged prostate without lower urinary tract symptoms: Secondary | ICD-10-CM | POA: Diagnosis present

## 2015-07-13 DIAGNOSIS — K862 Cyst of pancreas: Secondary | ICD-10-CM | POA: Diagnosis not present

## 2015-07-13 DIAGNOSIS — K91871 Postprocedural hematoma of a digestive system organ or structure following other procedure: Secondary | ICD-10-CM | POA: Diagnosis not present

## 2015-07-24 DIAGNOSIS — Z48815 Encounter for surgical aftercare following surgery on the digestive system: Secondary | ICD-10-CM | POA: Diagnosis not present

## 2015-07-24 DIAGNOSIS — N401 Enlarged prostate with lower urinary tract symptoms: Secondary | ICD-10-CM | POA: Diagnosis not present

## 2015-07-24 DIAGNOSIS — I493 Ventricular premature depolarization: Secondary | ICD-10-CM | POA: Diagnosis not present

## 2015-07-24 DIAGNOSIS — Z90411 Acquired partial absence of pancreas: Secondary | ICD-10-CM | POA: Diagnosis not present

## 2015-07-24 DIAGNOSIS — R2689 Other abnormalities of gait and mobility: Secondary | ICD-10-CM | POA: Diagnosis not present

## 2015-07-24 DIAGNOSIS — Z9049 Acquired absence of other specified parts of digestive tract: Secondary | ICD-10-CM | POA: Diagnosis not present

## 2015-07-24 DIAGNOSIS — Z87891 Personal history of nicotine dependence: Secondary | ICD-10-CM | POA: Diagnosis not present

## 2015-07-24 DIAGNOSIS — K862 Cyst of pancreas: Secondary | ICD-10-CM | POA: Diagnosis not present

## 2015-07-28 DIAGNOSIS — Z9049 Acquired absence of other specified parts of digestive tract: Secondary | ICD-10-CM | POA: Diagnosis not present

## 2015-07-28 DIAGNOSIS — K862 Cyst of pancreas: Secondary | ICD-10-CM | POA: Diagnosis not present

## 2015-07-28 DIAGNOSIS — I493 Ventricular premature depolarization: Secondary | ICD-10-CM | POA: Diagnosis not present

## 2015-07-28 DIAGNOSIS — Z48815 Encounter for surgical aftercare following surgery on the digestive system: Secondary | ICD-10-CM | POA: Diagnosis not present

## 2015-07-28 DIAGNOSIS — N401 Enlarged prostate with lower urinary tract symptoms: Secondary | ICD-10-CM | POA: Diagnosis not present

## 2015-07-28 DIAGNOSIS — R2689 Other abnormalities of gait and mobility: Secondary | ICD-10-CM | POA: Diagnosis not present

## 2015-07-29 DIAGNOSIS — Z48815 Encounter for surgical aftercare following surgery on the digestive system: Secondary | ICD-10-CM | POA: Diagnosis not present

## 2015-07-29 DIAGNOSIS — R2689 Other abnormalities of gait and mobility: Secondary | ICD-10-CM | POA: Diagnosis not present

## 2015-07-29 DIAGNOSIS — I493 Ventricular premature depolarization: Secondary | ICD-10-CM | POA: Diagnosis not present

## 2015-07-29 DIAGNOSIS — K862 Cyst of pancreas: Secondary | ICD-10-CM | POA: Diagnosis not present

## 2015-07-29 DIAGNOSIS — N401 Enlarged prostate with lower urinary tract symptoms: Secondary | ICD-10-CM | POA: Diagnosis not present

## 2015-07-29 DIAGNOSIS — Z9049 Acquired absence of other specified parts of digestive tract: Secondary | ICD-10-CM | POA: Diagnosis not present

## 2015-07-31 DIAGNOSIS — Z9049 Acquired absence of other specified parts of digestive tract: Secondary | ICD-10-CM | POA: Diagnosis not present

## 2015-07-31 DIAGNOSIS — R2689 Other abnormalities of gait and mobility: Secondary | ICD-10-CM | POA: Diagnosis not present

## 2015-07-31 DIAGNOSIS — K862 Cyst of pancreas: Secondary | ICD-10-CM | POA: Diagnosis not present

## 2015-07-31 DIAGNOSIS — N401 Enlarged prostate with lower urinary tract symptoms: Secondary | ICD-10-CM | POA: Diagnosis not present

## 2015-07-31 DIAGNOSIS — I493 Ventricular premature depolarization: Secondary | ICD-10-CM | POA: Diagnosis not present

## 2015-07-31 DIAGNOSIS — Z48815 Encounter for surgical aftercare following surgery on the digestive system: Secondary | ICD-10-CM | POA: Diagnosis not present

## 2015-08-04 DIAGNOSIS — N401 Enlarged prostate with lower urinary tract symptoms: Secondary | ICD-10-CM | POA: Diagnosis not present

## 2015-08-04 DIAGNOSIS — Z9049 Acquired absence of other specified parts of digestive tract: Secondary | ICD-10-CM | POA: Diagnosis not present

## 2015-08-04 DIAGNOSIS — K862 Cyst of pancreas: Secondary | ICD-10-CM | POA: Diagnosis not present

## 2015-08-04 DIAGNOSIS — R2689 Other abnormalities of gait and mobility: Secondary | ICD-10-CM | POA: Diagnosis not present

## 2015-08-04 DIAGNOSIS — Z48815 Encounter for surgical aftercare following surgery on the digestive system: Secondary | ICD-10-CM | POA: Diagnosis not present

## 2015-08-04 DIAGNOSIS — I493 Ventricular premature depolarization: Secondary | ICD-10-CM | POA: Diagnosis not present

## 2015-08-06 DIAGNOSIS — Z9049 Acquired absence of other specified parts of digestive tract: Secondary | ICD-10-CM | POA: Diagnosis not present

## 2015-08-06 DIAGNOSIS — R2689 Other abnormalities of gait and mobility: Secondary | ICD-10-CM | POA: Diagnosis not present

## 2015-08-06 DIAGNOSIS — K862 Cyst of pancreas: Secondary | ICD-10-CM | POA: Diagnosis not present

## 2015-08-06 DIAGNOSIS — N401 Enlarged prostate with lower urinary tract symptoms: Secondary | ICD-10-CM | POA: Diagnosis not present

## 2015-08-06 DIAGNOSIS — I493 Ventricular premature depolarization: Secondary | ICD-10-CM | POA: Diagnosis not present

## 2015-08-06 DIAGNOSIS — Z48815 Encounter for surgical aftercare following surgery on the digestive system: Secondary | ICD-10-CM | POA: Diagnosis not present

## 2015-08-07 DIAGNOSIS — D490 Neoplasm of unspecified behavior of digestive system: Secondary | ICD-10-CM | POA: Diagnosis not present

## 2015-08-07 DIAGNOSIS — Z9049 Acquired absence of other specified parts of digestive tract: Secondary | ICD-10-CM | POA: Diagnosis not present

## 2015-08-07 DIAGNOSIS — K862 Cyst of pancreas: Secondary | ICD-10-CM | POA: Diagnosis not present

## 2015-08-07 DIAGNOSIS — N401 Enlarged prostate with lower urinary tract symptoms: Secondary | ICD-10-CM | POA: Diagnosis not present

## 2015-08-07 DIAGNOSIS — Z48815 Encounter for surgical aftercare following surgery on the digestive system: Secondary | ICD-10-CM | POA: Diagnosis not present

## 2015-08-07 DIAGNOSIS — R2689 Other abnormalities of gait and mobility: Secondary | ICD-10-CM | POA: Diagnosis not present

## 2015-08-07 DIAGNOSIS — I493 Ventricular premature depolarization: Secondary | ICD-10-CM | POA: Diagnosis not present

## 2015-08-10 DIAGNOSIS — L570 Actinic keratosis: Secondary | ICD-10-CM | POA: Diagnosis not present

## 2015-08-10 DIAGNOSIS — D1801 Hemangioma of skin and subcutaneous tissue: Secondary | ICD-10-CM | POA: Diagnosis not present

## 2015-08-10 DIAGNOSIS — Z85828 Personal history of other malignant neoplasm of skin: Secondary | ICD-10-CM | POA: Diagnosis not present

## 2015-08-10 DIAGNOSIS — L853 Xerosis cutis: Secondary | ICD-10-CM | POA: Diagnosis not present

## 2015-08-11 DIAGNOSIS — K862 Cyst of pancreas: Secondary | ICD-10-CM | POA: Diagnosis not present

## 2015-08-11 DIAGNOSIS — N401 Enlarged prostate with lower urinary tract symptoms: Secondary | ICD-10-CM | POA: Diagnosis not present

## 2015-08-11 DIAGNOSIS — Z48815 Encounter for surgical aftercare following surgery on the digestive system: Secondary | ICD-10-CM | POA: Diagnosis not present

## 2015-08-11 DIAGNOSIS — I493 Ventricular premature depolarization: Secondary | ICD-10-CM | POA: Diagnosis not present

## 2015-08-11 DIAGNOSIS — R2689 Other abnormalities of gait and mobility: Secondary | ICD-10-CM | POA: Diagnosis not present

## 2015-08-11 DIAGNOSIS — Z9049 Acquired absence of other specified parts of digestive tract: Secondary | ICD-10-CM | POA: Diagnosis not present

## 2015-08-12 DIAGNOSIS — Z79899 Other long term (current) drug therapy: Secondary | ICD-10-CM | POA: Diagnosis not present

## 2015-08-12 DIAGNOSIS — Z9081 Acquired absence of spleen: Secondary | ICD-10-CM | POA: Diagnosis not present

## 2015-08-12 DIAGNOSIS — Z9049 Acquired absence of other specified parts of digestive tract: Secondary | ICD-10-CM | POA: Diagnosis not present

## 2015-08-12 DIAGNOSIS — N401 Enlarged prostate with lower urinary tract symptoms: Secondary | ICD-10-CM | POA: Diagnosis not present

## 2015-08-12 DIAGNOSIS — Z48815 Encounter for surgical aftercare following surgery on the digestive system: Secondary | ICD-10-CM | POA: Diagnosis not present

## 2015-08-12 DIAGNOSIS — R2689 Other abnormalities of gait and mobility: Secondary | ICD-10-CM | POA: Diagnosis not present

## 2015-08-12 DIAGNOSIS — K862 Cyst of pancreas: Secondary | ICD-10-CM | POA: Diagnosis not present

## 2015-08-12 DIAGNOSIS — D649 Anemia, unspecified: Secondary | ICD-10-CM | POA: Diagnosis not present

## 2015-08-12 DIAGNOSIS — I493 Ventricular premature depolarization: Secondary | ICD-10-CM | POA: Diagnosis not present

## 2015-08-12 DIAGNOSIS — C801 Malignant (primary) neoplasm, unspecified: Secondary | ICD-10-CM | POA: Diagnosis not present

## 2015-08-13 DIAGNOSIS — N401 Enlarged prostate with lower urinary tract symptoms: Secondary | ICD-10-CM | POA: Diagnosis not present

## 2015-08-13 DIAGNOSIS — I493 Ventricular premature depolarization: Secondary | ICD-10-CM | POA: Diagnosis not present

## 2015-08-13 DIAGNOSIS — Z48815 Encounter for surgical aftercare following surgery on the digestive system: Secondary | ICD-10-CM | POA: Diagnosis not present

## 2015-08-13 DIAGNOSIS — R2689 Other abnormalities of gait and mobility: Secondary | ICD-10-CM | POA: Diagnosis not present

## 2015-08-13 DIAGNOSIS — Z9049 Acquired absence of other specified parts of digestive tract: Secondary | ICD-10-CM | POA: Diagnosis not present

## 2015-08-13 DIAGNOSIS — K862 Cyst of pancreas: Secondary | ICD-10-CM | POA: Diagnosis not present

## 2015-08-17 DIAGNOSIS — Z23 Encounter for immunization: Secondary | ICD-10-CM | POA: Diagnosis not present

## 2015-08-18 DIAGNOSIS — R2689 Other abnormalities of gait and mobility: Secondary | ICD-10-CM | POA: Diagnosis not present

## 2015-08-18 DIAGNOSIS — N401 Enlarged prostate with lower urinary tract symptoms: Secondary | ICD-10-CM | POA: Diagnosis not present

## 2015-08-18 DIAGNOSIS — Z48815 Encounter for surgical aftercare following surgery on the digestive system: Secondary | ICD-10-CM | POA: Diagnosis not present

## 2015-08-18 DIAGNOSIS — K862 Cyst of pancreas: Secondary | ICD-10-CM | POA: Diagnosis not present

## 2015-08-18 DIAGNOSIS — I493 Ventricular premature depolarization: Secondary | ICD-10-CM | POA: Diagnosis not present

## 2015-08-18 DIAGNOSIS — Z9049 Acquired absence of other specified parts of digestive tract: Secondary | ICD-10-CM | POA: Diagnosis not present

## 2015-08-19 DIAGNOSIS — Z48815 Encounter for surgical aftercare following surgery on the digestive system: Secondary | ICD-10-CM | POA: Diagnosis not present

## 2015-08-19 DIAGNOSIS — I493 Ventricular premature depolarization: Secondary | ICD-10-CM | POA: Diagnosis not present

## 2015-08-19 DIAGNOSIS — K862 Cyst of pancreas: Secondary | ICD-10-CM | POA: Diagnosis not present

## 2015-08-19 DIAGNOSIS — R2689 Other abnormalities of gait and mobility: Secondary | ICD-10-CM | POA: Diagnosis not present

## 2015-08-19 DIAGNOSIS — N401 Enlarged prostate with lower urinary tract symptoms: Secondary | ICD-10-CM | POA: Diagnosis not present

## 2015-08-19 DIAGNOSIS — Z9049 Acquired absence of other specified parts of digestive tract: Secondary | ICD-10-CM | POA: Diagnosis not present

## 2015-08-20 DIAGNOSIS — K862 Cyst of pancreas: Secondary | ICD-10-CM | POA: Diagnosis not present

## 2015-08-20 DIAGNOSIS — I493 Ventricular premature depolarization: Secondary | ICD-10-CM | POA: Diagnosis not present

## 2015-08-20 DIAGNOSIS — Z9049 Acquired absence of other specified parts of digestive tract: Secondary | ICD-10-CM | POA: Diagnosis not present

## 2015-08-20 DIAGNOSIS — N401 Enlarged prostate with lower urinary tract symptoms: Secondary | ICD-10-CM | POA: Diagnosis not present

## 2015-08-20 DIAGNOSIS — Z48815 Encounter for surgical aftercare following surgery on the digestive system: Secondary | ICD-10-CM | POA: Diagnosis not present

## 2015-08-20 DIAGNOSIS — R2689 Other abnormalities of gait and mobility: Secondary | ICD-10-CM | POA: Diagnosis not present

## 2015-08-26 DIAGNOSIS — Z48815 Encounter for surgical aftercare following surgery on the digestive system: Secondary | ICD-10-CM | POA: Diagnosis not present

## 2015-08-26 DIAGNOSIS — N401 Enlarged prostate with lower urinary tract symptoms: Secondary | ICD-10-CM | POA: Diagnosis not present

## 2015-08-26 DIAGNOSIS — K862 Cyst of pancreas: Secondary | ICD-10-CM | POA: Diagnosis not present

## 2015-08-26 DIAGNOSIS — Z9049 Acquired absence of other specified parts of digestive tract: Secondary | ICD-10-CM | POA: Diagnosis not present

## 2015-08-26 DIAGNOSIS — R2689 Other abnormalities of gait and mobility: Secondary | ICD-10-CM | POA: Diagnosis not present

## 2015-08-26 DIAGNOSIS — I493 Ventricular premature depolarization: Secondary | ICD-10-CM | POA: Diagnosis not present

## 2015-09-03 DIAGNOSIS — Z48815 Encounter for surgical aftercare following surgery on the digestive system: Secondary | ICD-10-CM | POA: Diagnosis not present

## 2015-09-03 DIAGNOSIS — K862 Cyst of pancreas: Secondary | ICD-10-CM | POA: Diagnosis not present

## 2015-09-03 DIAGNOSIS — R2689 Other abnormalities of gait and mobility: Secondary | ICD-10-CM | POA: Diagnosis not present

## 2015-09-03 DIAGNOSIS — Z9049 Acquired absence of other specified parts of digestive tract: Secondary | ICD-10-CM | POA: Diagnosis not present

## 2015-09-03 DIAGNOSIS — I493 Ventricular premature depolarization: Secondary | ICD-10-CM | POA: Diagnosis not present

## 2015-09-03 DIAGNOSIS — R001 Bradycardia, unspecified: Secondary | ICD-10-CM | POA: Diagnosis not present

## 2015-09-03 DIAGNOSIS — N401 Enlarged prostate with lower urinary tract symptoms: Secondary | ICD-10-CM | POA: Diagnosis not present

## 2015-09-08 DIAGNOSIS — K862 Cyst of pancreas: Secondary | ICD-10-CM | POA: Diagnosis not present

## 2015-09-08 DIAGNOSIS — R2689 Other abnormalities of gait and mobility: Secondary | ICD-10-CM | POA: Diagnosis not present

## 2015-09-08 DIAGNOSIS — N401 Enlarged prostate with lower urinary tract symptoms: Secondary | ICD-10-CM | POA: Diagnosis not present

## 2015-09-08 DIAGNOSIS — Z9049 Acquired absence of other specified parts of digestive tract: Secondary | ICD-10-CM | POA: Diagnosis not present

## 2015-09-08 DIAGNOSIS — Z48815 Encounter for surgical aftercare following surgery on the digestive system: Secondary | ICD-10-CM | POA: Diagnosis not present

## 2015-09-08 DIAGNOSIS — I493 Ventricular premature depolarization: Secondary | ICD-10-CM | POA: Diagnosis not present

## 2015-10-13 DIAGNOSIS — I472 Ventricular tachycardia: Secondary | ICD-10-CM | POA: Diagnosis not present

## 2015-10-13 DIAGNOSIS — Z8719 Personal history of other diseases of the digestive system: Secondary | ICD-10-CM | POA: Diagnosis not present

## 2015-10-13 DIAGNOSIS — I499 Cardiac arrhythmia, unspecified: Secondary | ICD-10-CM | POA: Diagnosis not present

## 2015-10-13 DIAGNOSIS — I493 Ventricular premature depolarization: Secondary | ICD-10-CM | POA: Diagnosis not present

## 2015-10-13 DIAGNOSIS — Z882 Allergy status to sulfonamides status: Secondary | ICD-10-CM | POA: Diagnosis not present

## 2015-10-13 DIAGNOSIS — Z9889 Other specified postprocedural states: Secondary | ICD-10-CM | POA: Diagnosis not present

## 2015-10-13 DIAGNOSIS — R001 Bradycardia, unspecified: Secondary | ICD-10-CM | POA: Diagnosis not present

## 2015-11-02 DIAGNOSIS — I499 Cardiac arrhythmia, unspecified: Secondary | ICD-10-CM | POA: Diagnosis not present

## 2015-11-12 DIAGNOSIS — H2513 Age-related nuclear cataract, bilateral: Secondary | ICD-10-CM | POA: Diagnosis not present

## 2015-11-26 DIAGNOSIS — R7301 Impaired fasting glucose: Secondary | ICD-10-CM | POA: Diagnosis not present

## 2015-12-21 DIAGNOSIS — N281 Cyst of kidney, acquired: Secondary | ICD-10-CM | POA: Diagnosis not present

## 2015-12-21 DIAGNOSIS — R972 Elevated prostate specific antigen [PSA]: Secondary | ICD-10-CM | POA: Diagnosis not present

## 2015-12-21 DIAGNOSIS — N401 Enlarged prostate with lower urinary tract symptoms: Secondary | ICD-10-CM | POA: Diagnosis not present

## 2016-02-08 DIAGNOSIS — Z85828 Personal history of other malignant neoplasm of skin: Secondary | ICD-10-CM | POA: Diagnosis not present

## 2016-02-08 DIAGNOSIS — C44719 Basal cell carcinoma of skin of left lower limb, including hip: Secondary | ICD-10-CM | POA: Diagnosis not present

## 2016-02-08 DIAGNOSIS — D1801 Hemangioma of skin and subcutaneous tissue: Secondary | ICD-10-CM | POA: Diagnosis not present

## 2016-02-08 DIAGNOSIS — L400 Psoriasis vulgaris: Secondary | ICD-10-CM | POA: Diagnosis not present

## 2016-02-08 DIAGNOSIS — D225 Melanocytic nevi of trunk: Secondary | ICD-10-CM | POA: Diagnosis not present

## 2016-02-08 DIAGNOSIS — D485 Neoplasm of uncertain behavior of skin: Secondary | ICD-10-CM | POA: Diagnosis not present

## 2016-02-08 DIAGNOSIS — L570 Actinic keratosis: Secondary | ICD-10-CM | POA: Diagnosis not present

## 2016-02-08 DIAGNOSIS — L814 Other melanin hyperpigmentation: Secondary | ICD-10-CM | POA: Diagnosis not present

## 2016-02-26 DIAGNOSIS — Z90411 Acquired partial absence of pancreas: Secondary | ICD-10-CM | POA: Diagnosis not present

## 2016-02-26 DIAGNOSIS — Z9889 Other specified postprocedural states: Secondary | ICD-10-CM | POA: Diagnosis not present

## 2016-02-26 DIAGNOSIS — E1169 Type 2 diabetes mellitus with other specified complication: Secondary | ICD-10-CM | POA: Diagnosis not present

## 2016-02-26 DIAGNOSIS — Z9081 Acquired absence of spleen: Secondary | ICD-10-CM | POA: Diagnosis not present

## 2016-02-26 DIAGNOSIS — Z800 Family history of malignant neoplasm of digestive organs: Secondary | ICD-10-CM | POA: Diagnosis not present

## 2016-02-26 DIAGNOSIS — Z9049 Acquired absence of other specified parts of digestive tract: Secondary | ICD-10-CM | POA: Diagnosis not present

## 2016-02-26 DIAGNOSIS — K8689 Other specified diseases of pancreas: Secondary | ICD-10-CM | POA: Diagnosis not present

## 2016-02-26 DIAGNOSIS — Z8042 Family history of malignant neoplasm of prostate: Secondary | ICD-10-CM | POA: Diagnosis not present

## 2016-02-26 DIAGNOSIS — Z87891 Personal history of nicotine dependence: Secondary | ICD-10-CM | POA: Diagnosis not present

## 2016-02-26 DIAGNOSIS — K869 Disease of pancreas, unspecified: Secondary | ICD-10-CM | POA: Diagnosis not present

## 2016-03-08 DIAGNOSIS — R7301 Impaired fasting glucose: Secondary | ICD-10-CM | POA: Diagnosis not present

## 2016-06-09 DIAGNOSIS — R7301 Impaired fasting glucose: Secondary | ICD-10-CM | POA: Diagnosis not present

## 2016-06-09 DIAGNOSIS — Z23 Encounter for immunization: Secondary | ICD-10-CM | POA: Diagnosis not present

## 2016-06-20 DIAGNOSIS — N138 Other obstructive and reflux uropathy: Secondary | ICD-10-CM | POA: Diagnosis not present

## 2016-06-20 DIAGNOSIS — N281 Cyst of kidney, acquired: Secondary | ICD-10-CM | POA: Diagnosis not present

## 2016-06-20 DIAGNOSIS — N401 Enlarged prostate with lower urinary tract symptoms: Secondary | ICD-10-CM | POA: Diagnosis not present

## 2016-06-20 DIAGNOSIS — R972 Elevated prostate specific antigen [PSA]: Secondary | ICD-10-CM | POA: Diagnosis not present

## 2016-08-02 DIAGNOSIS — H2513 Age-related nuclear cataract, bilateral: Secondary | ICD-10-CM | POA: Diagnosis not present

## 2016-08-09 DIAGNOSIS — L438 Other lichen planus: Secondary | ICD-10-CM | POA: Diagnosis not present

## 2016-08-09 DIAGNOSIS — L570 Actinic keratosis: Secondary | ICD-10-CM | POA: Diagnosis not present

## 2016-08-09 DIAGNOSIS — D1801 Hemangioma of skin and subcutaneous tissue: Secondary | ICD-10-CM | POA: Diagnosis not present

## 2016-08-09 DIAGNOSIS — L814 Other melanin hyperpigmentation: Secondary | ICD-10-CM | POA: Diagnosis not present

## 2016-08-09 DIAGNOSIS — Z85828 Personal history of other malignant neoplasm of skin: Secondary | ICD-10-CM | POA: Diagnosis not present

## 2016-08-09 DIAGNOSIS — L821 Other seborrheic keratosis: Secondary | ICD-10-CM | POA: Diagnosis not present

## 2016-08-19 DIAGNOSIS — J069 Acute upper respiratory infection, unspecified: Secondary | ICD-10-CM | POA: Diagnosis not present

## 2016-08-26 DIAGNOSIS — N400 Enlarged prostate without lower urinary tract symptoms: Secondary | ICD-10-CM | POA: Diagnosis not present

## 2016-08-26 DIAGNOSIS — Z48815 Encounter for surgical aftercare following surgery on the digestive system: Secondary | ICD-10-CM | POA: Diagnosis not present

## 2016-08-26 DIAGNOSIS — Z87891 Personal history of nicotine dependence: Secondary | ICD-10-CM | POA: Diagnosis not present

## 2016-08-26 DIAGNOSIS — K869 Disease of pancreas, unspecified: Secondary | ICD-10-CM | POA: Diagnosis not present

## 2016-08-26 DIAGNOSIS — D490 Neoplasm of unspecified behavior of digestive system: Secondary | ICD-10-CM | POA: Diagnosis not present

## 2016-08-26 DIAGNOSIS — E119 Type 2 diabetes mellitus without complications: Secondary | ICD-10-CM | POA: Diagnosis not present

## 2016-08-26 DIAGNOSIS — Z9081 Acquired absence of spleen: Secondary | ICD-10-CM | POA: Diagnosis not present

## 2016-08-26 DIAGNOSIS — Z90411 Acquired partial absence of pancreas: Secondary | ICD-10-CM | POA: Diagnosis not present

## 2016-08-26 DIAGNOSIS — Z9889 Other specified postprocedural states: Secondary | ICD-10-CM | POA: Diagnosis not present

## 2016-08-29 HISTORY — PX: HERNIA REPAIR: SHX51

## 2016-09-12 DIAGNOSIS — S8012XA Contusion of left lower leg, initial encounter: Secondary | ICD-10-CM | POA: Diagnosis not present

## 2016-09-12 DIAGNOSIS — I10 Essential (primary) hypertension: Secondary | ICD-10-CM | POA: Diagnosis not present

## 2016-09-23 DIAGNOSIS — Z9889 Other specified postprocedural states: Secondary | ICD-10-CM | POA: Diagnosis not present

## 2016-09-23 DIAGNOSIS — K4090 Unilateral inguinal hernia, without obstruction or gangrene, not specified as recurrent: Secondary | ICD-10-CM | POA: Diagnosis not present

## 2016-10-02 DIAGNOSIS — N418 Other inflammatory diseases of prostate: Secondary | ICD-10-CM | POA: Diagnosis not present

## 2016-10-03 DIAGNOSIS — N410 Acute prostatitis: Secondary | ICD-10-CM | POA: Diagnosis not present

## 2016-10-03 DIAGNOSIS — N138 Other obstructive and reflux uropathy: Secondary | ICD-10-CM | POA: Diagnosis not present

## 2016-10-03 DIAGNOSIS — N401 Enlarged prostate with lower urinary tract symptoms: Secondary | ICD-10-CM | POA: Diagnosis not present

## 2016-10-03 DIAGNOSIS — R972 Elevated prostate specific antigen [PSA]: Secondary | ICD-10-CM | POA: Diagnosis not present

## 2016-10-31 DIAGNOSIS — N401 Enlarged prostate with lower urinary tract symptoms: Secondary | ICD-10-CM | POA: Diagnosis not present

## 2016-10-31 DIAGNOSIS — R001 Bradycardia, unspecified: Secondary | ICD-10-CM | POA: Diagnosis not present

## 2016-10-31 DIAGNOSIS — Z7982 Long term (current) use of aspirin: Secondary | ICD-10-CM | POA: Diagnosis not present

## 2016-10-31 DIAGNOSIS — I493 Ventricular premature depolarization: Secondary | ICD-10-CM | POA: Diagnosis not present

## 2016-10-31 DIAGNOSIS — Z79899 Other long term (current) drug therapy: Secondary | ICD-10-CM | POA: Diagnosis not present

## 2016-10-31 DIAGNOSIS — K4020 Bilateral inguinal hernia, without obstruction or gangrene, not specified as recurrent: Secondary | ICD-10-CM | POA: Diagnosis not present

## 2016-10-31 DIAGNOSIS — Z882 Allergy status to sulfonamides status: Secondary | ICD-10-CM | POA: Diagnosis not present

## 2016-10-31 DIAGNOSIS — N138 Other obstructive and reflux uropathy: Secondary | ICD-10-CM | POA: Diagnosis not present

## 2016-10-31 DIAGNOSIS — Z87891 Personal history of nicotine dependence: Secondary | ICD-10-CM | POA: Diagnosis not present

## 2016-10-31 DIAGNOSIS — N320 Bladder-neck obstruction: Secondary | ICD-10-CM | POA: Diagnosis not present

## 2016-10-31 DIAGNOSIS — I472 Ventricular tachycardia: Secondary | ICD-10-CM | POA: Diagnosis not present

## 2016-10-31 DIAGNOSIS — I272 Pulmonary hypertension, unspecified: Secondary | ICD-10-CM | POA: Diagnosis not present

## 2016-10-31 DIAGNOSIS — D176 Benign lipomatous neoplasm of spermatic cord: Secondary | ICD-10-CM | POA: Diagnosis not present

## 2016-11-14 DIAGNOSIS — I493 Ventricular premature depolarization: Secondary | ICD-10-CM | POA: Diagnosis not present

## 2016-11-14 DIAGNOSIS — I472 Ventricular tachycardia: Secondary | ICD-10-CM | POA: Diagnosis not present

## 2016-11-17 DIAGNOSIS — N138 Other obstructive and reflux uropathy: Secondary | ICD-10-CM | POA: Diagnosis not present

## 2016-11-17 DIAGNOSIS — I472 Ventricular tachycardia: Secondary | ICD-10-CM | POA: Diagnosis not present

## 2016-11-17 DIAGNOSIS — D176 Benign lipomatous neoplasm of spermatic cord: Secondary | ICD-10-CM | POA: Diagnosis not present

## 2016-11-17 DIAGNOSIS — I272 Pulmonary hypertension, unspecified: Secondary | ICD-10-CM | POA: Diagnosis not present

## 2016-11-17 DIAGNOSIS — K4090 Unilateral inguinal hernia, without obstruction or gangrene, not specified as recurrent: Secondary | ICD-10-CM | POA: Diagnosis not present

## 2016-11-17 DIAGNOSIS — N401 Enlarged prostate with lower urinary tract symptoms: Secondary | ICD-10-CM | POA: Diagnosis not present

## 2016-11-17 DIAGNOSIS — K4020 Bilateral inguinal hernia, without obstruction or gangrene, not specified as recurrent: Secondary | ICD-10-CM | POA: Diagnosis not present

## 2016-12-16 DIAGNOSIS — Z48815 Encounter for surgical aftercare following surgery on the digestive system: Secondary | ICD-10-CM | POA: Diagnosis not present

## 2016-12-19 DIAGNOSIS — R972 Elevated prostate specific antigen [PSA]: Secondary | ICD-10-CM | POA: Diagnosis not present

## 2016-12-19 DIAGNOSIS — N138 Other obstructive and reflux uropathy: Secondary | ICD-10-CM | POA: Diagnosis not present

## 2016-12-19 DIAGNOSIS — N401 Enlarged prostate with lower urinary tract symptoms: Secondary | ICD-10-CM | POA: Diagnosis not present

## 2017-02-07 DIAGNOSIS — L821 Other seborrheic keratosis: Secondary | ICD-10-CM | POA: Diagnosis not present

## 2017-02-07 DIAGNOSIS — L814 Other melanin hyperpigmentation: Secondary | ICD-10-CM | POA: Diagnosis not present

## 2017-02-07 DIAGNOSIS — D485 Neoplasm of uncertain behavior of skin: Secondary | ICD-10-CM | POA: Diagnosis not present

## 2017-02-07 DIAGNOSIS — C44519 Basal cell carcinoma of skin of other part of trunk: Secondary | ICD-10-CM | POA: Diagnosis not present

## 2017-02-07 DIAGNOSIS — D1801 Hemangioma of skin and subcutaneous tissue: Secondary | ICD-10-CM | POA: Diagnosis not present

## 2017-02-07 DIAGNOSIS — L720 Epidermal cyst: Secondary | ICD-10-CM | POA: Diagnosis not present

## 2017-02-07 DIAGNOSIS — L570 Actinic keratosis: Secondary | ICD-10-CM | POA: Diagnosis not present

## 2017-02-07 DIAGNOSIS — D1722 Benign lipomatous neoplasm of skin and subcutaneous tissue of left arm: Secondary | ICD-10-CM | POA: Diagnosis not present

## 2017-02-07 DIAGNOSIS — D692 Other nonthrombocytopenic purpura: Secondary | ICD-10-CM | POA: Diagnosis not present

## 2017-02-07 DIAGNOSIS — Z85828 Personal history of other malignant neoplasm of skin: Secondary | ICD-10-CM | POA: Diagnosis not present

## 2017-02-13 DIAGNOSIS — Z125 Encounter for screening for malignant neoplasm of prostate: Secondary | ICD-10-CM | POA: Diagnosis not present

## 2017-02-13 DIAGNOSIS — Z79899 Other long term (current) drug therapy: Secondary | ICD-10-CM | POA: Diagnosis not present

## 2017-02-13 DIAGNOSIS — E782 Mixed hyperlipidemia: Secondary | ICD-10-CM | POA: Diagnosis not present

## 2017-02-13 DIAGNOSIS — M899 Disorder of bone, unspecified: Secondary | ICD-10-CM | POA: Diagnosis not present

## 2017-02-13 DIAGNOSIS — M8580 Other specified disorders of bone density and structure, unspecified site: Secondary | ICD-10-CM | POA: Diagnosis not present

## 2017-02-16 DIAGNOSIS — M8580 Other specified disorders of bone density and structure, unspecified site: Secondary | ICD-10-CM | POA: Diagnosis not present

## 2017-02-16 DIAGNOSIS — E782 Mixed hyperlipidemia: Secondary | ICD-10-CM | POA: Diagnosis not present

## 2017-02-16 DIAGNOSIS — C801 Malignant (primary) neoplasm, unspecified: Secondary | ICD-10-CM | POA: Diagnosis not present

## 2017-02-16 DIAGNOSIS — E875 Hyperkalemia: Secondary | ICD-10-CM | POA: Diagnosis not present

## 2017-02-16 DIAGNOSIS — R7303 Prediabetes: Secondary | ICD-10-CM | POA: Diagnosis not present

## 2017-02-16 DIAGNOSIS — Z8679 Personal history of other diseases of the circulatory system: Secondary | ICD-10-CM | POA: Diagnosis not present

## 2017-02-16 DIAGNOSIS — Z0000 Encounter for general adult medical examination without abnormal findings: Secondary | ICD-10-CM | POA: Diagnosis not present

## 2017-02-16 DIAGNOSIS — R7301 Impaired fasting glucose: Secondary | ICD-10-CM | POA: Diagnosis not present

## 2017-02-16 DIAGNOSIS — Z79899 Other long term (current) drug therapy: Secondary | ICD-10-CM | POA: Diagnosis not present

## 2017-02-16 DIAGNOSIS — R972 Elevated prostate specific antigen [PSA]: Secondary | ICD-10-CM | POA: Diagnosis not present

## 2017-02-16 DIAGNOSIS — L409 Psoriasis, unspecified: Secondary | ICD-10-CM | POA: Diagnosis not present

## 2017-02-16 DIAGNOSIS — J3089 Other allergic rhinitis: Secondary | ICD-10-CM | POA: Diagnosis not present

## 2017-02-24 DIAGNOSIS — Z90411 Acquired partial absence of pancreas: Secondary | ICD-10-CM | POA: Diagnosis not present

## 2017-02-24 DIAGNOSIS — Z90410 Acquired total absence of pancreas: Secondary | ICD-10-CM | POA: Diagnosis not present

## 2017-02-24 DIAGNOSIS — Z86018 Personal history of other benign neoplasm: Secondary | ICD-10-CM | POA: Diagnosis not present

## 2017-02-24 DIAGNOSIS — N400 Enlarged prostate without lower urinary tract symptoms: Secondary | ICD-10-CM | POA: Diagnosis not present

## 2017-02-24 DIAGNOSIS — D490 Neoplasm of unspecified behavior of digestive system: Secondary | ICD-10-CM | POA: Diagnosis not present

## 2017-02-24 DIAGNOSIS — Z8603 Personal history of neoplasm of uncertain behavior: Secondary | ICD-10-CM | POA: Diagnosis not present

## 2017-02-24 DIAGNOSIS — Z9889 Other specified postprocedural states: Secondary | ICD-10-CM | POA: Diagnosis not present

## 2017-02-24 DIAGNOSIS — Z08 Encounter for follow-up examination after completed treatment for malignant neoplasm: Secondary | ICD-10-CM | POA: Diagnosis not present

## 2017-02-24 DIAGNOSIS — Z9081 Acquired absence of spleen: Secondary | ICD-10-CM | POA: Diagnosis not present

## 2017-04-03 DIAGNOSIS — M8588 Other specified disorders of bone density and structure, other site: Secondary | ICD-10-CM | POA: Diagnosis not present

## 2017-06-06 DIAGNOSIS — Z23 Encounter for immunization: Secondary | ICD-10-CM | POA: Diagnosis not present

## 2017-06-06 DIAGNOSIS — H2513 Age-related nuclear cataract, bilateral: Secondary | ICD-10-CM | POA: Diagnosis not present

## 2017-06-26 DIAGNOSIS — R972 Elevated prostate specific antigen [PSA]: Secondary | ICD-10-CM | POA: Diagnosis not present

## 2017-06-26 DIAGNOSIS — N281 Cyst of kidney, acquired: Secondary | ICD-10-CM | POA: Diagnosis not present

## 2017-06-26 DIAGNOSIS — N401 Enlarged prostate with lower urinary tract symptoms: Secondary | ICD-10-CM | POA: Diagnosis not present

## 2017-09-27 DIAGNOSIS — L570 Actinic keratosis: Secondary | ICD-10-CM | POA: Diagnosis not present

## 2017-09-27 DIAGNOSIS — L400 Psoriasis vulgaris: Secondary | ICD-10-CM | POA: Diagnosis not present

## 2017-09-27 DIAGNOSIS — L814 Other melanin hyperpigmentation: Secondary | ICD-10-CM | POA: Diagnosis not present

## 2017-09-27 DIAGNOSIS — Z85828 Personal history of other malignant neoplasm of skin: Secondary | ICD-10-CM | POA: Diagnosis not present

## 2017-09-27 DIAGNOSIS — D1801 Hemangioma of skin and subcutaneous tissue: Secondary | ICD-10-CM | POA: Diagnosis not present

## 2017-09-27 DIAGNOSIS — L821 Other seborrheic keratosis: Secondary | ICD-10-CM | POA: Diagnosis not present

## 2017-11-27 DIAGNOSIS — I472 Ventricular tachycardia: Secondary | ICD-10-CM | POA: Diagnosis not present

## 2017-11-27 DIAGNOSIS — I493 Ventricular premature depolarization: Secondary | ICD-10-CM | POA: Diagnosis not present

## 2017-11-27 DIAGNOSIS — Z79899 Other long term (current) drug therapy: Secondary | ICD-10-CM | POA: Diagnosis not present

## 2017-11-29 DIAGNOSIS — M79662 Pain in left lower leg: Secondary | ICD-10-CM | POA: Diagnosis not present

## 2017-11-29 DIAGNOSIS — R252 Cramp and spasm: Secondary | ICD-10-CM | POA: Diagnosis not present

## 2017-11-29 DIAGNOSIS — R030 Elevated blood-pressure reading, without diagnosis of hypertension: Secondary | ICD-10-CM | POA: Diagnosis not present

## 2017-12-04 DIAGNOSIS — M79605 Pain in left leg: Secondary | ICD-10-CM | POA: Diagnosis not present

## 2017-12-04 DIAGNOSIS — M7122 Synovial cyst of popliteal space [Baker], left knee: Secondary | ICD-10-CM | POA: Diagnosis not present

## 2017-12-04 DIAGNOSIS — M7981 Nontraumatic hematoma of soft tissue: Secondary | ICD-10-CM | POA: Diagnosis not present

## 2017-12-04 DIAGNOSIS — Z882 Allergy status to sulfonamides status: Secondary | ICD-10-CM | POA: Diagnosis not present

## 2017-12-08 DIAGNOSIS — Z86010 Personal history of colonic polyps: Secondary | ICD-10-CM | POA: Diagnosis not present

## 2017-12-25 DIAGNOSIS — N401 Enlarged prostate with lower urinary tract symptoms: Secondary | ICD-10-CM | POA: Diagnosis not present

## 2017-12-25 DIAGNOSIS — Z90411 Acquired partial absence of pancreas: Secondary | ICD-10-CM | POA: Diagnosis not present

## 2017-12-25 DIAGNOSIS — N281 Cyst of kidney, acquired: Secondary | ICD-10-CM | POA: Diagnosis not present

## 2017-12-25 DIAGNOSIS — R972 Elevated prostate specific antigen [PSA]: Secondary | ICD-10-CM | POA: Diagnosis not present

## 2017-12-25 DIAGNOSIS — N138 Other obstructive and reflux uropathy: Secondary | ICD-10-CM | POA: Diagnosis not present

## 2017-12-25 DIAGNOSIS — Z9081 Acquired absence of spleen: Secondary | ICD-10-CM | POA: Diagnosis not present

## 2018-01-12 ENCOUNTER — Other Ambulatory Visit: Payer: Self-pay | Admitting: Orthopedic Surgery

## 2018-01-12 DIAGNOSIS — M19041 Primary osteoarthritis, right hand: Secondary | ICD-10-CM | POA: Diagnosis not present

## 2018-01-12 DIAGNOSIS — M25841 Other specified joint disorders, right hand: Secondary | ICD-10-CM | POA: Diagnosis not present

## 2018-01-26 ENCOUNTER — Encounter (HOSPITAL_BASED_OUTPATIENT_CLINIC_OR_DEPARTMENT_OTHER): Payer: Self-pay | Admitting: *Deleted

## 2018-01-26 ENCOUNTER — Other Ambulatory Visit: Payer: Self-pay

## 2018-01-26 NOTE — Progress Notes (Signed)
Pt states he takes baby aspirin daily per his general practitioner.  Instructed pt to call cardiologist and make sure he is okay with pt discontinuing ASA for 5 days prior to surgery.

## 2018-02-06 ENCOUNTER — Other Ambulatory Visit: Payer: Self-pay

## 2018-02-06 ENCOUNTER — Encounter (HOSPITAL_BASED_OUTPATIENT_CLINIC_OR_DEPARTMENT_OTHER): Payer: Self-pay | Admitting: Certified Registered"

## 2018-02-06 ENCOUNTER — Encounter (HOSPITAL_BASED_OUTPATIENT_CLINIC_OR_DEPARTMENT_OTHER)
Admission: RE | Disposition: A | Discharge status: Home / Self Care | Payer: Self-pay | Source: Ambulatory Visit | Attending: Orthopedic Surgery

## 2018-02-06 ENCOUNTER — Ambulatory Visit (HOSPITAL_BASED_OUTPATIENT_CLINIC_OR_DEPARTMENT_OTHER)
Admission: RE | Admit: 2018-02-06 | Discharge: 2018-02-06 | Disposition: A | Discharge status: Home / Self Care | Payer: Medicare Other | Source: Ambulatory Visit | Attending: Orthopedic Surgery | Admitting: Orthopedic Surgery

## 2018-02-06 ENCOUNTER — Ambulatory Visit (HOSPITAL_BASED_OUTPATIENT_CLINIC_OR_DEPARTMENT_OTHER): Payer: Medicare Other | Admitting: Certified Registered"

## 2018-02-06 DIAGNOSIS — M67441 Ganglion, right hand: Secondary | ICD-10-CM | POA: Diagnosis not present

## 2018-02-06 DIAGNOSIS — Z7951 Long term (current) use of inhaled steroids: Secondary | ICD-10-CM | POA: Insufficient documentation

## 2018-02-06 DIAGNOSIS — M25741 Osteophyte, right hand: Secondary | ICD-10-CM | POA: Diagnosis not present

## 2018-02-06 DIAGNOSIS — Z7982 Long term (current) use of aspirin: Secondary | ICD-10-CM | POA: Insufficient documentation

## 2018-02-06 DIAGNOSIS — R2231 Localized swelling, mass and lump, right upper limb: Secondary | ICD-10-CM | POA: Diagnosis present

## 2018-02-06 DIAGNOSIS — M19041 Primary osteoarthritis, right hand: Secondary | ICD-10-CM | POA: Insufficient documentation

## 2018-02-06 DIAGNOSIS — L729 Follicular cyst of the skin and subcutaneous tissue, unspecified: Secondary | ICD-10-CM | POA: Diagnosis not present

## 2018-02-06 DIAGNOSIS — M71341 Other bursal cyst, right hand: Secondary | ICD-10-CM | POA: Diagnosis not present

## 2018-02-06 DIAGNOSIS — Z79899 Other long term (current) drug therapy: Secondary | ICD-10-CM | POA: Insufficient documentation

## 2018-02-06 DIAGNOSIS — Z87891 Personal history of nicotine dependence: Secondary | ICD-10-CM | POA: Diagnosis not present

## 2018-02-06 DIAGNOSIS — I10 Essential (primary) hypertension: Secondary | ICD-10-CM | POA: Diagnosis not present

## 2018-02-06 HISTORY — DX: Essential (primary) hypertension: I10

## 2018-02-06 HISTORY — DX: Cardiac arrhythmia, unspecified: I49.9

## 2018-02-06 HISTORY — DX: Unspecified osteoarthritis, unspecified site: M19.90

## 2018-02-06 HISTORY — PX: MASS EXCISION: SHX2000

## 2018-02-06 SURGERY — EXCISION MASS
Anesthesia: Regional | Site: Hand | Laterality: Right

## 2018-02-06 MED ORDER — PROMETHAZINE HCL 25 MG/ML IJ SOLN: 6.2500 mg | INTRAMUSCULAR | Status: DC | PRN

## 2018-02-06 MED ORDER — SCOPOLAMINE 1 MG/3DAYS TD PT72: 1.0000 | MEDICATED_PATCH | Freq: Once | TRANSDERMAL | Status: DC | PRN

## 2018-02-06 MED ORDER — PROPOFOL 500 MG/50ML IV EMUL
INTRAVENOUS | Status: DC | PRN
  Administered 2018-02-06: 75 ug/kg/min via INTRAVENOUS

## 2018-02-06 MED ORDER — ONDANSETRON HCL 4 MG/2ML IJ SOLN
INTRAMUSCULAR | Status: DC | PRN
  Administered 2018-02-06: 4 mg via INTRAVENOUS

## 2018-02-06 MED ORDER — OXYCODONE HCL 5 MG PO TABS: 5.0000 mg | ORAL_TABLET | Freq: Once | ORAL | Status: DC | PRN

## 2018-02-06 MED ORDER — LIDOCAINE HCL (CARDIAC) PF 100 MG/5ML IV SOSY
PREFILLED_SYRINGE | INTRAVENOUS | Status: DC | PRN
  Administered 2018-02-06: 30 mg via INTRAVENOUS

## 2018-02-06 MED ORDER — LACTATED RINGERS IV SOLN: INTRAVENOUS | Status: DC

## 2018-02-06 MED ORDER — HYDROCODONE-ACETAMINOPHEN 5-325 MG PO TABS: 1.0000 | ORAL_TABLET | Freq: Four times a day (QID) | ORAL | 0 refills | Status: AC | PRN

## 2018-02-06 MED ORDER — LACTATED RINGERS IV SOLN
INTRAVENOUS | Status: DC
  Administered 2018-02-06: 10:00:00 via INTRAVENOUS

## 2018-02-06 MED ORDER — CHLORHEXIDINE GLUCONATE 4 % EX LIQD: 60.0000 mL | Freq: Once | CUTANEOUS | Status: DC

## 2018-02-06 MED ORDER — LIDOCAINE HCL (PF) 0.5 % IJ SOLN
INTRAMUSCULAR | Status: DC | PRN
  Administered 2018-02-06: 30 mL via INTRAVENOUS

## 2018-02-06 MED ORDER — BUPIVACAINE HCL (PF) 0.25 % IJ SOLN
INTRAMUSCULAR | Status: DC | PRN
  Administered 2018-02-06: 7 mL

## 2018-02-06 MED ORDER — CEFAZOLIN SODIUM-DEXTROSE 2-4 GM/100ML-% IV SOLN
2.0000 g | INTRAVENOUS | Status: AC
  Administered 2018-02-06: 2 g via INTRAVENOUS

## 2018-02-06 MED ORDER — MEPERIDINE HCL 25 MG/ML IJ SOLN: 6.2500 mg | INTRAMUSCULAR | Status: DC | PRN

## 2018-02-06 MED ORDER — FENTANYL CITRATE (PF) 100 MCG/2ML IJ SOLN
INTRAMUSCULAR | Status: AC
  Filled 2018-02-06: qty 2

## 2018-02-06 MED ORDER — FENTANYL CITRATE (PF) 100 MCG/2ML IJ SOLN: 25.0000 ug | INTRAMUSCULAR | Status: DC | PRN

## 2018-02-06 MED ORDER — OXYCODONE HCL 5 MG/5ML PO SOLN: 5.0000 mg | Freq: Once | ORAL | Status: DC | PRN

## 2018-02-06 MED ORDER — FENTANYL CITRATE (PF) 100 MCG/2ML IJ SOLN
50.0000 ug | INTRAMUSCULAR | Status: DC | PRN
  Administered 2018-02-06: 50 ug via INTRAVENOUS

## 2018-02-06 MED ORDER — CEFAZOLIN SODIUM-DEXTROSE 2-4 GM/100ML-% IV SOLN
INTRAVENOUS | Status: AC
Start: 2018-02-06 — End: ?
  Filled 2018-02-06: qty 100

## 2018-02-06 MED ORDER — MIDAZOLAM HCL 2 MG/2ML IJ SOLN: 1.0000 mg | INTRAMUSCULAR | Status: DC | PRN

## 2018-02-06 SURGICAL SUPPLY — 47 items
BANDAGE COBAN STERILE 2 (GAUZE/BANDAGES/DRESSINGS) IMPLANT
BLADE SURG 15 STRL LF DISP TIS (BLADE) ×1 IMPLANT
BLADE SURG 15 STRL SS (BLADE) ×2
BNDG COHESIVE 1X5 TAN STRL LF (GAUZE/BANDAGES/DRESSINGS) ×3 IMPLANT
BNDG COHESIVE 3X5 TAN STRL LF (GAUZE/BANDAGES/DRESSINGS) IMPLANT
BNDG ESMARK 4X9 LF (GAUZE/BANDAGES/DRESSINGS) IMPLANT
BNDG GAUZE ELAST 4 BULKY (GAUZE/BANDAGES/DRESSINGS) IMPLANT
CHLORAPREP W/TINT 26ML (MISCELLANEOUS) ×3 IMPLANT
CORD BIPOLAR FORCEPS 12FT (ELECTRODE) ×3 IMPLANT
COVER BACK TABLE 60X90IN (DRAPES) ×3 IMPLANT
COVER MAYO STAND STRL (DRAPES) ×3 IMPLANT
CUFF TOURNIQUET SINGLE 18IN (TOURNIQUET CUFF) ×3 IMPLANT
DECANTER SPIKE VIAL GLASS SM (MISCELLANEOUS) IMPLANT
DRAIN PENROSE 1/2X12 LTX STRL (WOUND CARE) IMPLANT
DRAPE EXTREMITY T 121X128X90 (DRAPE) ×3 IMPLANT
DRAPE SURG 17X23 STRL (DRAPES) ×3 IMPLANT
GAUZE SPONGE 4X4 12PLY STRL (GAUZE/BANDAGES/DRESSINGS) ×3 IMPLANT
GAUZE XEROFORM 1X8 LF (GAUZE/BANDAGES/DRESSINGS) ×3 IMPLANT
GLOVE BIO SURGEON STRL SZ 6.5 (GLOVE) ×2 IMPLANT
GLOVE BIO SURGEON STRL SZ7 (GLOVE) ×3 IMPLANT
GLOVE BIO SURGEONS STRL SZ 6.5 (GLOVE) ×1
GLOVE BIOGEL PI IND STRL 7.0 (GLOVE) ×2 IMPLANT
GLOVE BIOGEL PI IND STRL 7.5 (GLOVE) ×2 IMPLANT
GLOVE BIOGEL PI IND STRL 8.5 (GLOVE) ×1 IMPLANT
GLOVE BIOGEL PI INDICATOR 7.0 (GLOVE) ×4
GLOVE BIOGEL PI INDICATOR 7.5 (GLOVE) ×4
GLOVE BIOGEL PI INDICATOR 8.5 (GLOVE) ×2
GLOVE SURG ORTHO 8.0 STRL STRW (GLOVE) ×3 IMPLANT
GOWN STRL REUS W/ TWL LRG LVL3 (GOWN DISPOSABLE) ×1 IMPLANT
GOWN STRL REUS W/TWL LRG LVL3 (GOWN DISPOSABLE) ×2
GOWN STRL REUS W/TWL XL LVL3 (GOWN DISPOSABLE) ×6 IMPLANT
NDL SAFETY ECLIPSE 18X1.5 (NEEDLE) IMPLANT
NEEDLE HYPO 18GX1.5 SHARP (NEEDLE)
NEEDLE PRECISIONGLIDE 27X1.5 (NEEDLE) ×3 IMPLANT
NS IRRIG 1000ML POUR BTL (IV SOLUTION) ×3 IMPLANT
PACK BASIN DAY SURGERY FS (CUSTOM PROCEDURE TRAY) ×3 IMPLANT
PAD CAST 3X4 CTTN HI CHSV (CAST SUPPLIES) IMPLANT
PADDING CAST COTTON 3X4 STRL (CAST SUPPLIES)
SPLINT PLASTER CAST XFAST 3X15 (CAST SUPPLIES) IMPLANT
SPLINT PLASTER XTRA FASTSET 3X (CAST SUPPLIES)
STOCKINETTE 4X48 STRL (DRAPES) ×3 IMPLANT
SUT ETHILON 4 0 PS 2 18 (SUTURE) ×3 IMPLANT
SUT VIC AB 4-0 P2 18 (SUTURE) IMPLANT
SYR BULB 3OZ (MISCELLANEOUS) ×3 IMPLANT
SYR CONTROL 10ML LL (SYRINGE) ×3 IMPLANT
TOWEL GREEN STERILE FF (TOWEL DISPOSABLE) ×3 IMPLANT
UNDERPAD 30X30 (UNDERPADS AND DIAPERS) ×3 IMPLANT

## 2018-02-06 NOTE — Brief Op Note (Signed)
02/06/2018  11:11 AM  PATIENT:  Paschal Dopp  77 y.o. male  PRE-OPERATIVE DIAGNOSIS:  Mucoid Cyst RightThumb Degenerative Joint disease Interphalangeal  POST-OPERATIVE DIAGNOSIS:  Mucoid Cyst RightThumb Degenerative Joint disease   PROCEDURE:  Procedure(s): EXCISION CYST DEBRIDEMENT INTERPHALANGEAL JOINT RIGHT THUMB (Right)  SURGEON:  Surgeon(s) and Role:    * Daryll Brod, MD - Primary  PHYSICIAN ASSISTANT:   ASSISTANTS: none   ANESTHESIA:   local, regional and IV sedation  EBL: 59ml  BLOOD ADMINISTERED:none  DRAINS: none   LOCAL MEDICATIONS USED:  BUPIVICAINE   SPECIMEN:  Excision  DISPOSITION OF SPECIMEN:  PATHOLOGY  COUNTS:  YES  TOURNIQUET:   Total Tourniquet Time Documented: Forearm (Right) - 30 minutes Total: Forearm (Right) - 30 minutes   DICTATION: .Dragon Dictation  PLAN OF CARE: Discharge to home after PACU  PATIENT DISPOSITION:  PACU - hemodynamically stable.

## 2018-02-06 NOTE — Anesthesia Preprocedure Evaluation (Addendum)
Anesthesia Evaluation  Patient identified by MRN, date of birth, ID band Patient awake    Reviewed: Allergy & Precautions, NPO status , Patient's Chart, lab work & pertinent test results  Airway Mallampati: I  TM Distance: >3 FB Neck ROM: Full    Dental  (+) Teeth Intact, Dental Advisory Given   Pulmonary former smoker,    breath sounds clear to auscultation       Cardiovascular hypertension, Pt. on home beta blockers + dysrhythmias  Rhythm:Regular Rate:Normal     Neuro/Psych negative neurological ROS  negative psych ROS   GI/Hepatic negative GI ROS, Neg liver ROS,   Endo/Other  negative endocrine ROS  Renal/GU negative Renal ROS     Musculoskeletal  (+) Arthritis ,   Abdominal Normal abdominal exam  (+)   Peds  Hematology negative hematology ROS (+)   Anesthesia Other Findings - HLD  Reproductive/Obstetrics                           Anesthesia Physical Anesthesia Plan  ASA: II  Anesthesia Plan: Bier Block and Bier Block-LIDOCAINE ONLY   Post-op Pain Management:    Induction: Intravenous  PONV Risk Score and Plan: 2 and Ondansetron and Propofol infusion  Airway Management Planned: Simple Face Mask  Additional Equipment: None  Intra-op Plan:   Post-operative Plan:   Informed Consent: I have reviewed the patients History and Physical, chart, labs and discussed the procedure including the risks, benefits and alternatives for the proposed anesthesia with the patient or authorized representative who has indicated his/her understanding and acceptance.   Dental advisory given  Plan Discussed with: CRNA  Anesthesia Plan Comments:        Anesthesia Quick Evaluation

## 2018-02-06 NOTE — Anesthesia Procedure Notes (Signed)
Procedure Name: MAC Date/Time: 02/06/2018 10:45 AM Performed by: Signe Colt, CRNA Pre-anesthesia Checklist: Patient identified, Emergency Drugs available, Suction available, Patient being monitored and Timeout performed Patient Re-evaluated:Patient Re-evaluated prior to induction Oxygen Delivery Method: Simple face mask

## 2018-02-06 NOTE — Anesthesia Procedure Notes (Signed)
Anesthesia Regional Block: Bier block (IV Regional)   Pre-Anesthetic Checklist: ,, timeout performed, Correct Patient, Correct Site, Correct Laterality, Correct Procedure,, site marked, surgical consent,, at surgeon's request  Laterality: Right     Needles:  Injection technique: Single-shot  Needle Type: Other      Needle Gauge: 22     Additional Needles:   Procedures:,,,,, intact distal pulses, Esmarch exsanguination, single tourniquet utilized,  Narrative:   Performed by: Personally       

## 2018-02-06 NOTE — Transfer of Care (Signed)
Immediate Anesthesia Transfer of Care Note  Patient: Todd Nguyen  Procedure(s) Performed: EXCISION CYST DEBRIDEMENT INTERPHALANGEAL JOINT RIGHT THUMB (Right Hand)  Patient Location: PACU  Anesthesia Type:Bier block  Level of Consciousness: awake, alert , oriented and patient cooperative  Airway & Oxygen Therapy: Patient Spontanous Breathing and Patient connected to face mask oxygen  Post-op Assessment: Report given to RN and Post -op Vital signs reviewed and stable  Post vital signs: Reviewed and stable  Last Vitals:  Vitals Value Taken Time  BP    Temp    Pulse    Resp    SpO2      Last Pain:  Vitals:   02/06/18 0923  TempSrc: Oral         Complications: No apparent anesthesia complications

## 2018-02-06 NOTE — Op Note (Signed)
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  Surgeon: Daryll Brod  Assistant: None  Anesthesia forearm-based IV regional with IV sedation and metacarpal block  Placed surgery: [facility]  History: The patient is a 77 year old male with a large mass cystic lesion over the IP area of his right thumb slightly mid lateral.  This has been gradually enlarging.  X-rays reveal significant degenerative changes of the interphalangeal joint.  He is desirous of having this excised.  Pre-peri-and postoperative course been discussed along with risks and complications.  He is aware that there is no guarantee to the surgery the possibility of recurrence injury to arteries nerves tendons complete relief symptoms dystrophy possibility of infection.  These are all outlined with him.  He is advised that the only way that a guarantee for no further formation of cystic lesion would be fusion of the IP joint.  Preoperative area the patient is seen extremity marked by both patient and surgeon antibiotic given.  Procedure: Patient brought to the operating room where a forearm-based IV regional anesthetic was carried out without difficulty under the direction the anesthesia department.  Was prepped using ChloraPrep in the supine position with the right arm free.  A three-minute dry time was allowed and timeout taken to confirm patient procedure.  A metacarpal block was given with quarter percent bupivacaine without epinephrine approximately 9 cc was used.  A curvilinear incision was then made over the interphalangeal joint carried down along the mid lateral line of the right thumb.  This carried down through subcutaneous tissue.  Bleeders were electrocauterized with bipolar as necessary large cystic lesion was immediately encountered.  With blunt sharp dissection  this was dissected free.  This was then followed into the interphalangeal joint through the long stalk.  The joint was then opened on its radial border.  The synovial tissue was markedly enlarged.  This was removed.  This revealed a large osteophyte on the dorsal aspect of the proximal phalanx.  This was then removed along with the synovial tissue from the joint with a hemostatic rondure.  This move the entire dorsal aspect of the proximal phalanx off.  The wound was copiously irrigated with saline.  Specimen was sent to pathology.  The wound was then closed with interrupted 4-0 nylon sutures.  A sterile compressive dressing splint to the thumb applied.  Inflation of the tourniquet remaining fingers pink.  He was taken to the recovery room for observation in satisfactory condition.  He will be discharged home to return [place] in 1 week on Norco.  He will try ibuprofen Tylenol first if this is inadequate he will have been Norco for breakthrough.

## 2018-02-06 NOTE — Discharge Instructions (Addendum)

## 2018-02-06 NOTE — Anesthesia Postprocedure Evaluation (Signed)
Anesthesia Post Note  Patient: Todd Nguyen  Procedure(s) Performed: EXCISION CYST DEBRIDEMENT INTERPHALANGEAL JOINT RIGHT THUMB (Right Hand)     Patient location during evaluation: PACU Anesthesia Type: Bier Block Level of consciousness: awake and alert Pain management: pain level controlled Vital Signs Assessment: post-procedure vital signs reviewed and stable Respiratory status: spontaneous breathing, nonlabored ventilation, respiratory function stable and patient connected to nasal cannula oxygen Cardiovascular status: stable and blood pressure returned to baseline Postop Assessment: no apparent nausea or vomiting Anesthetic complications: no    Last Vitals:  Vitals:   02/06/18 1118 02/06/18 1140  BP: 131/75 (!) 160/90  Pulse:  96  Resp:  16  Temp: 36.6 C 36.5 C  SpO2: 100% 100%    Last Pain:  Vitals:   02/06/18 1140  TempSrc:   PainSc: 0-No pain                 Effie Berkshire

## 2018-02-06 NOTE — H&P (Signed)
Todd Nguyen is an 77 y.o. male.   Chief Complaint:mass right thumb UEA:[id number] is a 78 year old left-hand-dominant male who comes in with a mass on his right thumb IP joint area. This been present for approximately 1 year and enlarging. He recalls no history of injury. He does not have any pain or numbness and tingling with this. He states that it is just getting larger. He has no prior history of injury. He has no history of diabetes thyroid problems arthritis or gout. Family history is negative for to these also.      Past Medical History:  Diagnosis Date  . Anaphylaxis   . Arthritis    in thumb joint  . Chest pain   . Dysrhythmia    pvc's  . Elevated PSA   . Hyperlipidemia   . Hypertension     Past Surgical History:  Procedure Laterality Date  . HERNIA REPAIR  2018  . LAPAROSCOPIC CHOLECYSTECTOMY    . SPLENECTOMY  2016  . TONSILLECTOMY      Family History  Problem Relation Age of Onset  . Colon cancer Mother   . Prostate cancer Father    Social History:  reports that he quit smoking about 35 years ago. He quit after 15.00 years of use. He has never used smokeless tobacco. He reports that he does not use drugs. His alcohol history is not on file.  Allergies:  Allergies  Allergen Reactions  . Sulfa Antibiotics Anaphylaxis    anaphylatic shockl    Medications Prior to Admission  Medication Sig Dispense Refill  . aspirin EC 81 MG tablet Take 81 mg by mouth daily.    . Cholecalciferol (VITAMIN D) 2000 UNITS tablet Take 2,000 Units by mouth daily.    . finasteride (PROSCAR) 5 MG tablet Take 5 mg by mouth daily.    . metoprolol tartrate (LOPRESSOR) 25 MG tablet Take 25 mg by mouth 2 (two) times daily.    . Omega-3 Fatty Acids (FISH OIL PO) Take by mouth.    . tamsulosin (FLOMAX) 0.4 MG CAPS capsule Take 0.4 mg by mouth.    . Acetaminophen-Guaifenesin (TYLENOL CHEST CONGESTION) 500-200 MG/15ML LIQD Take by mouth.     . fluticasone (FLOVENT DISKUS) 50 MCG/BLIST  diskus inhaler Inhale 1 puff into the lungs 2 (two) times daily.      No results found for this or any previous visit (from the past 48 hour(s)).  No results found.   Pertinent items are noted in HPI.  Height 6' (1.829 m), weight 83.9 kg (185 lb).  General appearance: alert, cooperative and appears stated age Head: Normocephalic, without obvious abnormality Neck: no JVD Resp: clear to auscultation bilaterally Cardio: regular rate and rhythm, S1, S2 normal, no murmur, click, rub or gallop GI: soft, non-tender; bowel sounds normal; no masses,  no organomegaly Extremities: mass right thumb Pulses: 2+ and symmetric Skin: Skin color, texture, turgor normal. No rashes or lesions Neurologic: Grossly normal Incision/Wound: na  Assessment/Plan Assessment:  1. Mass  Right Thumb 2. Osteoarthritis of finger of right hand  3. Mucoid cyst, joint    Plan: Have discussed the etiology of the mucoid cyst in the degenerative arthritis of the thumb. We recommend serious consideration to surgical excision debridement of the IP joint. Pre-peri-postoperative course are discussed along with risk complications. He is aware there is no guarantee to the surgery the possibility of infection recurrence injury to arteries nerves tendons complete relief symptoms stiffness. He is advised that the only way he  can guarantee a non-recurrence of the cyst would be fusion of the joint and we do not recommend that at this point in time. Shins are encouraged and answered to his satisfaction. The skin he is scheduled for excision cyst debridement IP joint right thumb as an outpatient under regional anesthesia.      Mechelle Pates R 02/06/2018, 9:13 AM

## 2018-02-07 ENCOUNTER — Encounter (HOSPITAL_BASED_OUTPATIENT_CLINIC_OR_DEPARTMENT_OTHER): Payer: Self-pay | Admitting: Orthopedic Surgery

## 2018-03-08 DIAGNOSIS — H2513 Age-related nuclear cataract, bilateral: Secondary | ICD-10-CM | POA: Diagnosis not present

## 2018-03-15 DIAGNOSIS — Z87898 Personal history of other specified conditions: Secondary | ICD-10-CM | POA: Diagnosis not present

## 2018-03-15 DIAGNOSIS — Z125 Encounter for screening for malignant neoplasm of prostate: Secondary | ICD-10-CM | POA: Diagnosis not present

## 2018-03-15 DIAGNOSIS — E782 Mixed hyperlipidemia: Secondary | ICD-10-CM | POA: Diagnosis not present

## 2018-03-15 DIAGNOSIS — L409 Psoriasis, unspecified: Secondary | ICD-10-CM | POA: Diagnosis not present

## 2018-03-15 DIAGNOSIS — Z8679 Personal history of other diseases of the circulatory system: Secondary | ICD-10-CM | POA: Diagnosis not present

## 2018-03-15 DIAGNOSIS — Z9081 Acquired absence of spleen: Secondary | ICD-10-CM | POA: Diagnosis not present

## 2018-03-15 DIAGNOSIS — M8580 Other specified disorders of bone density and structure, unspecified site: Secondary | ICD-10-CM | POA: Diagnosis not present

## 2018-03-15 DIAGNOSIS — Z136 Encounter for screening for cardiovascular disorders: Secondary | ICD-10-CM | POA: Diagnosis not present

## 2018-03-15 DIAGNOSIS — Z79899 Other long term (current) drug therapy: Secondary | ICD-10-CM | POA: Diagnosis not present

## 2018-03-15 DIAGNOSIS — Z0000 Encounter for general adult medical examination without abnormal findings: Secondary | ICD-10-CM | POA: Diagnosis not present

## 2018-03-15 DIAGNOSIS — Z86010 Personal history of colonic polyps: Secondary | ICD-10-CM | POA: Diagnosis not present

## 2018-03-15 DIAGNOSIS — R7301 Impaired fasting glucose: Secondary | ICD-10-CM | POA: Diagnosis not present

## 2018-04-04 DIAGNOSIS — L02811 Cutaneous abscess of head [any part, except face]: Secondary | ICD-10-CM | POA: Diagnosis not present

## 2018-04-06 DIAGNOSIS — Z9081 Acquired absence of spleen: Secondary | ICD-10-CM | POA: Diagnosis not present

## 2018-04-06 DIAGNOSIS — Z90410 Acquired total absence of pancreas: Secondary | ICD-10-CM | POA: Diagnosis not present

## 2018-04-06 DIAGNOSIS — Z5189 Encounter for other specified aftercare: Secondary | ICD-10-CM | POA: Diagnosis not present

## 2018-04-06 DIAGNOSIS — Z483 Aftercare following surgery for neoplasm: Secondary | ICD-10-CM | POA: Diagnosis not present

## 2018-04-06 DIAGNOSIS — D490 Neoplasm of unspecified behavior of digestive system: Secondary | ICD-10-CM | POA: Diagnosis not present

## 2018-04-16 DIAGNOSIS — L309 Dermatitis, unspecified: Secondary | ICD-10-CM | POA: Diagnosis not present

## 2018-04-16 DIAGNOSIS — Z85828 Personal history of other malignant neoplasm of skin: Secondary | ICD-10-CM | POA: Diagnosis not present

## 2018-06-18 DIAGNOSIS — Z85828 Personal history of other malignant neoplasm of skin: Secondary | ICD-10-CM | POA: Diagnosis not present

## 2018-06-18 DIAGNOSIS — L814 Other melanin hyperpigmentation: Secondary | ICD-10-CM | POA: Diagnosis not present

## 2018-06-18 DIAGNOSIS — D1801 Hemangioma of skin and subcutaneous tissue: Secondary | ICD-10-CM | POA: Diagnosis not present

## 2018-06-18 DIAGNOSIS — L821 Other seborrheic keratosis: Secondary | ICD-10-CM | POA: Diagnosis not present

## 2018-06-18 DIAGNOSIS — L570 Actinic keratosis: Secondary | ICD-10-CM | POA: Diagnosis not present

## 2018-07-12 DIAGNOSIS — Z23 Encounter for immunization: Secondary | ICD-10-CM | POA: Diagnosis not present

## 2018-08-27 DIAGNOSIS — N281 Cyst of kidney, acquired: Secondary | ICD-10-CM | POA: Diagnosis not present

## 2018-08-27 DIAGNOSIS — N401 Enlarged prostate with lower urinary tract symptoms: Secondary | ICD-10-CM | POA: Diagnosis not present

## 2018-08-27 DIAGNOSIS — R972 Elevated prostate specific antigen [PSA]: Secondary | ICD-10-CM | POA: Diagnosis not present

## 2018-08-27 DIAGNOSIS — N138 Other obstructive and reflux uropathy: Secondary | ICD-10-CM | POA: Diagnosis not present

## 2018-12-18 DIAGNOSIS — L570 Actinic keratosis: Secondary | ICD-10-CM | POA: Diagnosis not present

## 2018-12-18 DIAGNOSIS — L400 Psoriasis vulgaris: Secondary | ICD-10-CM | POA: Diagnosis not present

## 2018-12-18 DIAGNOSIS — L814 Other melanin hyperpigmentation: Secondary | ICD-10-CM | POA: Diagnosis not present

## 2018-12-18 DIAGNOSIS — Z85828 Personal history of other malignant neoplasm of skin: Secondary | ICD-10-CM | POA: Diagnosis not present

## 2018-12-18 DIAGNOSIS — D225 Melanocytic nevi of trunk: Secondary | ICD-10-CM | POA: Diagnosis not present

## 2018-12-18 DIAGNOSIS — D692 Other nonthrombocytopenic purpura: Secondary | ICD-10-CM | POA: Diagnosis not present

## 2018-12-18 DIAGNOSIS — D1801 Hemangioma of skin and subcutaneous tissue: Secondary | ICD-10-CM | POA: Diagnosis not present

## 2019-01-28 DIAGNOSIS — H2513 Age-related nuclear cataract, bilateral: Secondary | ICD-10-CM | POA: Diagnosis not present

## 2019-02-25 DIAGNOSIS — R972 Elevated prostate specific antigen [PSA]: Secondary | ICD-10-CM | POA: Diagnosis not present

## 2019-02-25 DIAGNOSIS — N401 Enlarged prostate with lower urinary tract symptoms: Secondary | ICD-10-CM | POA: Diagnosis not present

## 2019-02-25 DIAGNOSIS — N281 Cyst of kidney, acquired: Secondary | ICD-10-CM | POA: Diagnosis not present

## 2019-02-25 DIAGNOSIS — N138 Other obstructive and reflux uropathy: Secondary | ICD-10-CM | POA: Diagnosis not present

## 2019-03-20 DIAGNOSIS — E782 Mixed hyperlipidemia: Secondary | ICD-10-CM | POA: Diagnosis not present

## 2019-03-20 DIAGNOSIS — Z86010 Personal history of colonic polyps: Secondary | ICD-10-CM | POA: Diagnosis not present

## 2019-03-20 DIAGNOSIS — Z9081 Acquired absence of spleen: Secondary | ICD-10-CM | POA: Diagnosis not present

## 2019-03-20 DIAGNOSIS — R7301 Impaired fasting glucose: Secondary | ICD-10-CM | POA: Diagnosis not present

## 2019-03-20 DIAGNOSIS — Z87891 Personal history of nicotine dependence: Secondary | ICD-10-CM | POA: Diagnosis not present

## 2019-03-20 DIAGNOSIS — Z79899 Other long term (current) drug therapy: Secondary | ICD-10-CM | POA: Diagnosis not present

## 2019-03-20 DIAGNOSIS — Z8679 Personal history of other diseases of the circulatory system: Secondary | ICD-10-CM | POA: Diagnosis not present

## 2019-03-20 DIAGNOSIS — Z87898 Personal history of other specified conditions: Secondary | ICD-10-CM | POA: Diagnosis not present

## 2019-03-20 DIAGNOSIS — L409 Psoriasis, unspecified: Secondary | ICD-10-CM | POA: Diagnosis not present

## 2019-03-20 DIAGNOSIS — M8580 Other specified disorders of bone density and structure, unspecified site: Secondary | ICD-10-CM | POA: Diagnosis not present

## 2019-03-21 ENCOUNTER — Other Ambulatory Visit: Payer: Self-pay | Admitting: Family Medicine

## 2019-03-21 DIAGNOSIS — Z87891 Personal history of nicotine dependence: Secondary | ICD-10-CM

## 2019-04-01 ENCOUNTER — Ambulatory Visit
Admission: RE | Admit: 2019-04-01 | Discharge: 2019-04-01 | Disposition: A | Discharge status: Home / Self Care | Payer: Medicare Other | Source: Ambulatory Visit | Attending: Family Medicine | Admitting: Family Medicine

## 2019-04-01 DIAGNOSIS — Z136 Encounter for screening for cardiovascular disorders: Secondary | ICD-10-CM | POA: Diagnosis not present

## 2019-04-01 DIAGNOSIS — Z87891 Personal history of nicotine dependence: Secondary | ICD-10-CM | POA: Diagnosis not present

## 2019-04-05 DIAGNOSIS — R001 Bradycardia, unspecified: Secondary | ICD-10-CM | POA: Diagnosis not present

## 2019-04-05 DIAGNOSIS — Z8679 Personal history of other diseases of the circulatory system: Secondary | ICD-10-CM | POA: Diagnosis not present

## 2019-04-05 DIAGNOSIS — Z9081 Acquired absence of spleen: Secondary | ICD-10-CM | POA: Diagnosis not present

## 2019-04-05 DIAGNOSIS — R7309 Other abnormal glucose: Secondary | ICD-10-CM | POA: Diagnosis not present

## 2019-04-05 DIAGNOSIS — L409 Psoriasis, unspecified: Secondary | ICD-10-CM | POA: Diagnosis not present

## 2019-04-05 DIAGNOSIS — M8580 Other specified disorders of bone density and structure, unspecified site: Secondary | ICD-10-CM | POA: Diagnosis not present

## 2019-04-05 DIAGNOSIS — Z79899 Other long term (current) drug therapy: Secondary | ICD-10-CM | POA: Diagnosis not present

## 2019-04-05 DIAGNOSIS — R7301 Impaired fasting glucose: Secondary | ICD-10-CM | POA: Diagnosis not present

## 2019-04-05 DIAGNOSIS — Z86010 Personal history of colonic polyps: Secondary | ICD-10-CM | POA: Diagnosis not present

## 2019-04-05 DIAGNOSIS — Z87898 Personal history of other specified conditions: Secondary | ICD-10-CM | POA: Diagnosis not present

## 2019-04-05 DIAGNOSIS — E782 Mixed hyperlipidemia: Secondary | ICD-10-CM | POA: Diagnosis not present

## 2019-04-12 DIAGNOSIS — Z9081 Acquired absence of spleen: Secondary | ICD-10-CM | POA: Diagnosis not present

## 2019-04-12 DIAGNOSIS — Z9049 Acquired absence of other specified parts of digestive tract: Secondary | ICD-10-CM | POA: Diagnosis not present

## 2019-04-12 DIAGNOSIS — K8681 Exocrine pancreatic insufficiency: Secondary | ICD-10-CM | POA: Diagnosis not present

## 2019-04-12 DIAGNOSIS — Z90411 Acquired partial absence of pancreas: Secondary | ICD-10-CM | POA: Diagnosis not present

## 2019-04-12 DIAGNOSIS — N1330 Unspecified hydronephrosis: Secondary | ICD-10-CM | POA: Diagnosis not present

## 2019-04-12 DIAGNOSIS — D490 Neoplasm of unspecified behavior of digestive system: Secondary | ICD-10-CM | POA: Diagnosis not present

## 2019-05-29 DIAGNOSIS — Z23 Encounter for immunization: Secondary | ICD-10-CM | POA: Diagnosis not present

## 2019-06-19 DIAGNOSIS — L821 Other seborrheic keratosis: Secondary | ICD-10-CM | POA: Diagnosis not present

## 2019-06-19 DIAGNOSIS — L570 Actinic keratosis: Secondary | ICD-10-CM | POA: Diagnosis not present

## 2019-06-19 DIAGNOSIS — Z85828 Personal history of other malignant neoplasm of skin: Secondary | ICD-10-CM | POA: Diagnosis not present

## 2019-06-19 DIAGNOSIS — D1801 Hemangioma of skin and subcutaneous tissue: Secondary | ICD-10-CM | POA: Diagnosis not present

## 2019-06-19 DIAGNOSIS — L814 Other melanin hyperpigmentation: Secondary | ICD-10-CM | POA: Diagnosis not present

## 2019-09-23 DIAGNOSIS — E782 Mixed hyperlipidemia: Secondary | ICD-10-CM | POA: Diagnosis not present

## 2019-09-23 DIAGNOSIS — M8580 Other specified disorders of bone density and structure, unspecified site: Secondary | ICD-10-CM | POA: Diagnosis not present

## 2019-10-18 DIAGNOSIS — M8580 Other specified disorders of bone density and structure, unspecified site: Secondary | ICD-10-CM | POA: Diagnosis not present

## 2019-10-18 DIAGNOSIS — E782 Mixed hyperlipidemia: Secondary | ICD-10-CM | POA: Diagnosis not present

## 2019-10-21 DIAGNOSIS — N281 Cyst of kidney, acquired: Secondary | ICD-10-CM | POA: Diagnosis not present

## 2019-10-21 DIAGNOSIS — R972 Elevated prostate specific antigen [PSA]: Secondary | ICD-10-CM | POA: Diagnosis not present

## 2019-10-21 DIAGNOSIS — N401 Enlarged prostate with lower urinary tract symptoms: Secondary | ICD-10-CM | POA: Diagnosis not present

## 2019-10-21 DIAGNOSIS — N138 Other obstructive and reflux uropathy: Secondary | ICD-10-CM | POA: Diagnosis not present

## 2019-11-11 DIAGNOSIS — H2513 Age-related nuclear cataract, bilateral: Secondary | ICD-10-CM | POA: Diagnosis not present

## 2019-12-18 DIAGNOSIS — D1801 Hemangioma of skin and subcutaneous tissue: Secondary | ICD-10-CM | POA: Diagnosis not present

## 2019-12-18 DIAGNOSIS — L400 Psoriasis vulgaris: Secondary | ICD-10-CM | POA: Diagnosis not present

## 2019-12-18 DIAGNOSIS — Z85828 Personal history of other malignant neoplasm of skin: Secondary | ICD-10-CM | POA: Diagnosis not present

## 2019-12-18 DIAGNOSIS — L814 Other melanin hyperpigmentation: Secondary | ICD-10-CM | POA: Diagnosis not present

## 2019-12-18 DIAGNOSIS — L570 Actinic keratosis: Secondary | ICD-10-CM | POA: Diagnosis not present

## 2019-12-19 DIAGNOSIS — Z03818 Encounter for observation for suspected exposure to other biological agents ruled out: Secondary | ICD-10-CM | POA: Diagnosis not present

## 2020-01-20 DIAGNOSIS — R001 Bradycardia, unspecified: Secondary | ICD-10-CM | POA: Diagnosis not present

## 2020-01-20 DIAGNOSIS — I499 Cardiac arrhythmia, unspecified: Secondary | ICD-10-CM | POA: Diagnosis not present

## 2020-01-20 DIAGNOSIS — I493 Ventricular premature depolarization: Secondary | ICD-10-CM | POA: Diagnosis not present

## 2020-04-10 DIAGNOSIS — Z9889 Other specified postprocedural states: Secondary | ICD-10-CM | POA: Diagnosis not present

## 2020-04-10 DIAGNOSIS — N1339 Other hydronephrosis: Secondary | ICD-10-CM | POA: Diagnosis not present

## 2020-04-10 DIAGNOSIS — D490 Neoplasm of unspecified behavior of digestive system: Secondary | ICD-10-CM | POA: Diagnosis not present

## 2020-04-10 DIAGNOSIS — Z9049 Acquired absence of other specified parts of digestive tract: Secondary | ICD-10-CM | POA: Diagnosis not present

## 2020-04-10 DIAGNOSIS — Z9081 Acquired absence of spleen: Secondary | ICD-10-CM | POA: Diagnosis not present

## 2020-04-20 DIAGNOSIS — R972 Elevated prostate specific antigen [PSA]: Secondary | ICD-10-CM | POA: Diagnosis not present

## 2020-04-20 DIAGNOSIS — N281 Cyst of kidney, acquired: Secondary | ICD-10-CM | POA: Diagnosis not present

## 2020-04-20 DIAGNOSIS — N401 Enlarged prostate with lower urinary tract symptoms: Secondary | ICD-10-CM | POA: Diagnosis not present

## 2020-04-20 DIAGNOSIS — N138 Other obstructive and reflux uropathy: Secondary | ICD-10-CM | POA: Diagnosis not present

## 2020-04-26 DIAGNOSIS — Z20828 Contact with and (suspected) exposure to other viral communicable diseases: Secondary | ICD-10-CM | POA: Diagnosis not present

## 2020-05-06 DIAGNOSIS — R7301 Impaired fasting glucose: Secondary | ICD-10-CM | POA: Diagnosis not present

## 2020-05-06 DIAGNOSIS — Z79899 Other long term (current) drug therapy: Secondary | ICD-10-CM | POA: Diagnosis not present

## 2020-05-06 DIAGNOSIS — R7309 Other abnormal glucose: Secondary | ICD-10-CM | POA: Diagnosis not present

## 2020-05-06 DIAGNOSIS — M899 Disorder of bone, unspecified: Secondary | ICD-10-CM | POA: Diagnosis not present

## 2020-05-06 DIAGNOSIS — E782 Mixed hyperlipidemia: Secondary | ICD-10-CM | POA: Diagnosis not present

## 2020-05-08 DIAGNOSIS — L409 Psoriasis, unspecified: Secondary | ICD-10-CM | POA: Diagnosis not present

## 2020-05-08 DIAGNOSIS — Z9081 Acquired absence of spleen: Secondary | ICD-10-CM | POA: Diagnosis not present

## 2020-05-08 DIAGNOSIS — Z0000 Encounter for general adult medical examination without abnormal findings: Secondary | ICD-10-CM | POA: Diagnosis not present

## 2020-05-08 DIAGNOSIS — Z87898 Personal history of other specified conditions: Secondary | ICD-10-CM | POA: Diagnosis not present

## 2020-05-08 DIAGNOSIS — R7303 Prediabetes: Secondary | ICD-10-CM | POA: Diagnosis not present

## 2020-05-08 DIAGNOSIS — Z86010 Personal history of colonic polyps: Secondary | ICD-10-CM | POA: Diagnosis not present

## 2020-05-08 DIAGNOSIS — E782 Mixed hyperlipidemia: Secondary | ICD-10-CM | POA: Diagnosis not present

## 2020-05-08 DIAGNOSIS — I493 Ventricular premature depolarization: Secondary | ICD-10-CM | POA: Diagnosis not present

## 2020-05-08 DIAGNOSIS — Z79899 Other long term (current) drug therapy: Secondary | ICD-10-CM | POA: Diagnosis not present

## 2020-05-08 DIAGNOSIS — Z23 Encounter for immunization: Secondary | ICD-10-CM | POA: Diagnosis not present

## 2020-05-08 DIAGNOSIS — M8580 Other specified disorders of bone density and structure, unspecified site: Secondary | ICD-10-CM | POA: Diagnosis not present

## 2020-05-20 DIAGNOSIS — Z20822 Contact with and (suspected) exposure to covid-19: Secondary | ICD-10-CM | POA: Diagnosis not present

## 2020-05-21 DIAGNOSIS — Z20828 Contact with and (suspected) exposure to other viral communicable diseases: Secondary | ICD-10-CM | POA: Diagnosis not present

## 2020-06-05 DIAGNOSIS — Z23 Encounter for immunization: Secondary | ICD-10-CM | POA: Diagnosis not present

## 2020-06-18 DIAGNOSIS — D485 Neoplasm of uncertain behavior of skin: Secondary | ICD-10-CM | POA: Diagnosis not present

## 2020-06-18 DIAGNOSIS — L400 Psoriasis vulgaris: Secondary | ICD-10-CM | POA: Diagnosis not present

## 2020-06-18 DIAGNOSIS — L821 Other seborrheic keratosis: Secondary | ICD-10-CM | POA: Diagnosis not present

## 2020-06-18 DIAGNOSIS — L570 Actinic keratosis: Secondary | ICD-10-CM | POA: Diagnosis not present

## 2020-06-18 DIAGNOSIS — D1801 Hemangioma of skin and subcutaneous tissue: Secondary | ICD-10-CM | POA: Diagnosis not present

## 2020-06-18 DIAGNOSIS — L814 Other melanin hyperpigmentation: Secondary | ICD-10-CM | POA: Diagnosis not present

## 2020-06-18 DIAGNOSIS — Z85828 Personal history of other malignant neoplasm of skin: Secondary | ICD-10-CM | POA: Diagnosis not present

## 2020-07-28 DIAGNOSIS — Z03818 Encounter for observation for suspected exposure to other biological agents ruled out: Secondary | ICD-10-CM | POA: Diagnosis not present

## 2020-09-10 DIAGNOSIS — Z20822 Contact with and (suspected) exposure to covid-19: Secondary | ICD-10-CM | POA: Diagnosis not present

## 2020-09-10 DIAGNOSIS — Z03818 Encounter for observation for suspected exposure to other biological agents ruled out: Secondary | ICD-10-CM | POA: Diagnosis not present

## 2020-09-16 DIAGNOSIS — Z20822 Contact with and (suspected) exposure to covid-19: Secondary | ICD-10-CM | POA: Diagnosis not present

## 2020-10-12 DIAGNOSIS — H2513 Age-related nuclear cataract, bilateral: Secondary | ICD-10-CM | POA: Diagnosis not present

## 2020-10-13 DIAGNOSIS — U071 COVID-19: Secondary | ICD-10-CM | POA: Diagnosis not present

## 2020-10-13 DIAGNOSIS — Z03818 Encounter for observation for suspected exposure to other biological agents ruled out: Secondary | ICD-10-CM | POA: Diagnosis not present

## 2020-11-12 DIAGNOSIS — Z03818 Encounter for observation for suspected exposure to other biological agents ruled out: Secondary | ICD-10-CM | POA: Diagnosis not present

## 2020-11-12 DIAGNOSIS — Z20822 Contact with and (suspected) exposure to covid-19: Secondary | ICD-10-CM | POA: Diagnosis not present

## 2020-12-01 DIAGNOSIS — Z20828 Contact with and (suspected) exposure to other viral communicable diseases: Secondary | ICD-10-CM | POA: Diagnosis not present

## 2020-12-01 DIAGNOSIS — J0100 Acute maxillary sinusitis, unspecified: Secondary | ICD-10-CM | POA: Diagnosis not present

## 2020-12-04 DIAGNOSIS — Z23 Encounter for immunization: Secondary | ICD-10-CM | POA: Diagnosis not present

## 2020-12-08 DIAGNOSIS — L814 Other melanin hyperpigmentation: Secondary | ICD-10-CM | POA: Diagnosis not present

## 2020-12-08 DIAGNOSIS — D1801 Hemangioma of skin and subcutaneous tissue: Secondary | ICD-10-CM | POA: Diagnosis not present

## 2020-12-08 DIAGNOSIS — Z85828 Personal history of other malignant neoplasm of skin: Secondary | ICD-10-CM | POA: Diagnosis not present

## 2020-12-08 DIAGNOSIS — D0339 Melanoma in situ of other parts of face: Secondary | ICD-10-CM | POA: Diagnosis not present

## 2020-12-08 DIAGNOSIS — D225 Melanocytic nevi of trunk: Secondary | ICD-10-CM | POA: Diagnosis not present

## 2020-12-08 DIAGNOSIS — L570 Actinic keratosis: Secondary | ICD-10-CM | POA: Diagnosis not present

## 2020-12-08 DIAGNOSIS — D485 Neoplasm of uncertain behavior of skin: Secondary | ICD-10-CM | POA: Diagnosis not present

## 2020-12-08 DIAGNOSIS — L821 Other seborrheic keratosis: Secondary | ICD-10-CM | POA: Diagnosis not present

## 2020-12-17 DIAGNOSIS — Z20828 Contact with and (suspected) exposure to other viral communicable diseases: Secondary | ICD-10-CM | POA: Diagnosis not present

## 2021-01-14 DIAGNOSIS — N138 Other obstructive and reflux uropathy: Secondary | ICD-10-CM | POA: Diagnosis not present

## 2021-01-14 DIAGNOSIS — N281 Cyst of kidney, acquired: Secondary | ICD-10-CM | POA: Diagnosis not present

## 2021-01-14 DIAGNOSIS — N401 Enlarged prostate with lower urinary tract symptoms: Secondary | ICD-10-CM | POA: Diagnosis not present

## 2021-01-14 DIAGNOSIS — R972 Elevated prostate specific antigen [PSA]: Secondary | ICD-10-CM | POA: Diagnosis not present

## 2021-01-20 DIAGNOSIS — Z03818 Encounter for observation for suspected exposure to other biological agents ruled out: Secondary | ICD-10-CM | POA: Diagnosis not present

## 2021-01-26 DIAGNOSIS — J029 Acute pharyngitis, unspecified: Secondary | ICD-10-CM | POA: Diagnosis not present

## 2021-01-26 DIAGNOSIS — U071 COVID-19: Secondary | ICD-10-CM | POA: Diagnosis not present

## 2021-01-27 DIAGNOSIS — U071 COVID-19: Secondary | ICD-10-CM | POA: Diagnosis not present

## 2021-02-06 DIAGNOSIS — J0100 Acute maxillary sinusitis, unspecified: Secondary | ICD-10-CM | POA: Diagnosis not present

## 2021-02-15 DIAGNOSIS — D0339 Melanoma in situ of other parts of face: Secondary | ICD-10-CM | POA: Diagnosis not present

## 2021-02-15 DIAGNOSIS — Z85828 Personal history of other malignant neoplasm of skin: Secondary | ICD-10-CM | POA: Diagnosis not present

## 2021-02-15 DIAGNOSIS — L988 Other specified disorders of the skin and subcutaneous tissue: Secondary | ICD-10-CM | POA: Diagnosis not present

## 2021-02-16 DIAGNOSIS — D0339 Melanoma in situ of other parts of face: Secondary | ICD-10-CM | POA: Diagnosis not present

## 2021-03-23 DIAGNOSIS — U071 COVID-19: Secondary | ICD-10-CM | POA: Diagnosis not present

## 2021-04-30 DIAGNOSIS — D490 Neoplasm of unspecified behavior of digestive system: Secondary | ICD-10-CM | POA: Diagnosis not present

## 2021-04-30 DIAGNOSIS — Z09 Encounter for follow-up examination after completed treatment for conditions other than malignant neoplasm: Secondary | ICD-10-CM | POA: Diagnosis not present

## 2021-04-30 DIAGNOSIS — Z8719 Personal history of other diseases of the digestive system: Secondary | ICD-10-CM | POA: Diagnosis not present

## 2021-05-20 DIAGNOSIS — R7303 Prediabetes: Secondary | ICD-10-CM | POA: Diagnosis not present

## 2021-05-20 DIAGNOSIS — E782 Mixed hyperlipidemia: Secondary | ICD-10-CM | POA: Diagnosis not present

## 2021-05-20 DIAGNOSIS — I493 Ventricular premature depolarization: Secondary | ICD-10-CM | POA: Diagnosis not present

## 2021-05-20 DIAGNOSIS — M8580 Other specified disorders of bone density and structure, unspecified site: Secondary | ICD-10-CM | POA: Diagnosis not present

## 2021-05-20 DIAGNOSIS — Z0000 Encounter for general adult medical examination without abnormal findings: Secondary | ICD-10-CM | POA: Diagnosis not present

## 2021-05-20 DIAGNOSIS — Z23 Encounter for immunization: Secondary | ICD-10-CM | POA: Diagnosis not present

## 2021-06-07 DIAGNOSIS — I471 Supraventricular tachycardia: Secondary | ICD-10-CM | POA: Diagnosis not present

## 2021-06-07 DIAGNOSIS — I493 Ventricular premature depolarization: Secondary | ICD-10-CM | POA: Diagnosis not present

## 2021-06-08 DIAGNOSIS — R001 Bradycardia, unspecified: Secondary | ICD-10-CM | POA: Diagnosis not present

## 2021-06-09 DIAGNOSIS — Z85828 Personal history of other malignant neoplasm of skin: Secondary | ICD-10-CM | POA: Diagnosis not present

## 2021-06-09 DIAGNOSIS — Z85820 Personal history of malignant melanoma of skin: Secondary | ICD-10-CM | POA: Diagnosis not present

## 2021-06-09 DIAGNOSIS — D1801 Hemangioma of skin and subcutaneous tissue: Secondary | ICD-10-CM | POA: Diagnosis not present

## 2021-06-09 DIAGNOSIS — L814 Other melanin hyperpigmentation: Secondary | ICD-10-CM | POA: Diagnosis not present

## 2021-06-09 DIAGNOSIS — L570 Actinic keratosis: Secondary | ICD-10-CM | POA: Diagnosis not present

## 2021-07-05 DIAGNOSIS — H2513 Age-related nuclear cataract, bilateral: Secondary | ICD-10-CM | POA: Diagnosis not present

## 2021-07-29 DIAGNOSIS — R972 Elevated prostate specific antigen [PSA]: Secondary | ICD-10-CM | POA: Diagnosis not present

## 2021-07-29 DIAGNOSIS — N281 Cyst of kidney, acquired: Secondary | ICD-10-CM | POA: Diagnosis not present

## 2021-07-29 DIAGNOSIS — N401 Enlarged prostate with lower urinary tract symptoms: Secondary | ICD-10-CM | POA: Diagnosis not present

## 2021-07-29 DIAGNOSIS — N138 Other obstructive and reflux uropathy: Secondary | ICD-10-CM | POA: Diagnosis not present

## 2021-07-31 IMAGING — US US ABDOMINAL AORTA SCREENING AAA
1 series · 7 of 7 positions shown · non-contrast
Comparison: None.

CLINICAL DATA: Male between 65-75 years of age with a smoking
history.

EXAM:
US ABDOMINAL AORTA MEDICARE SCREENING
TECHNIQUE: Ultrasound examination of the abdominal aorta was performed as a
screening evaluation for abdominal aortic aneurysm.

[Series 1: us abdominal aorta screening aaa · 0.25mm/px · 7 of 7 slices shown]
[im 1/7]
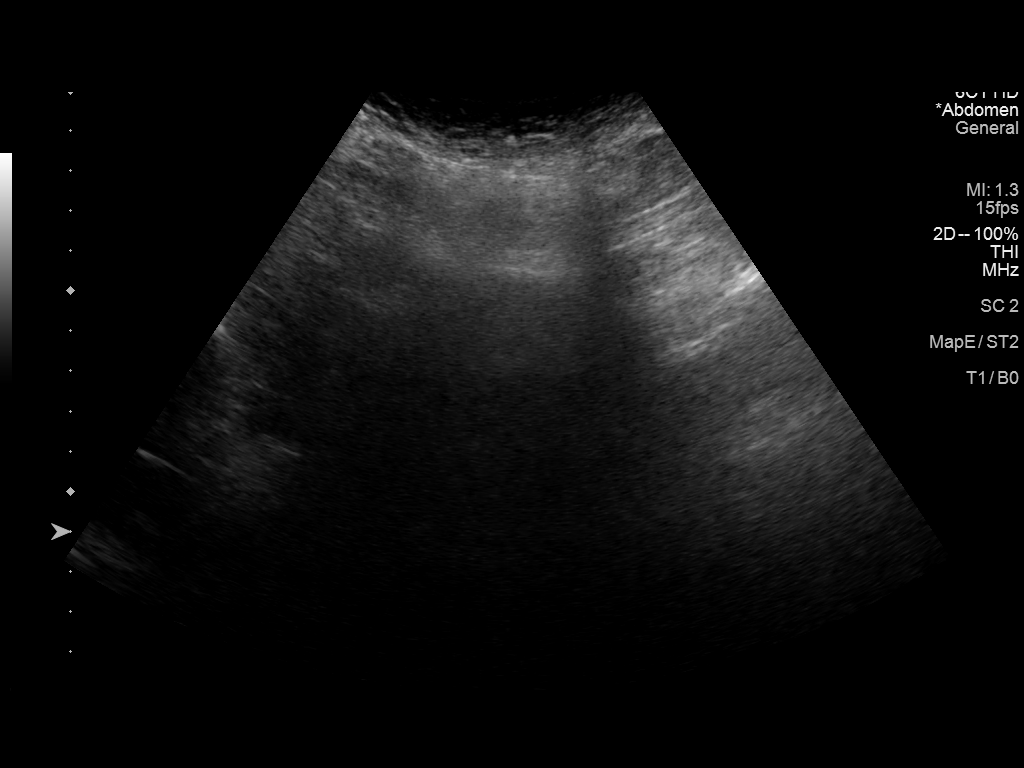
[im 2/7]
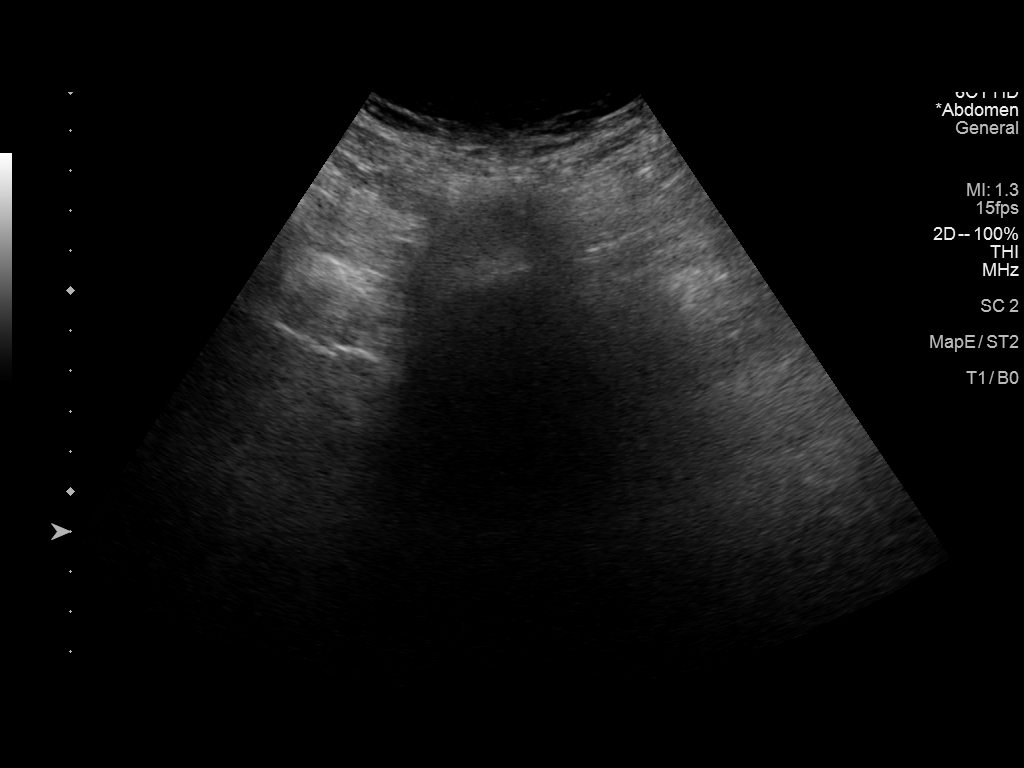
[im 3/7]
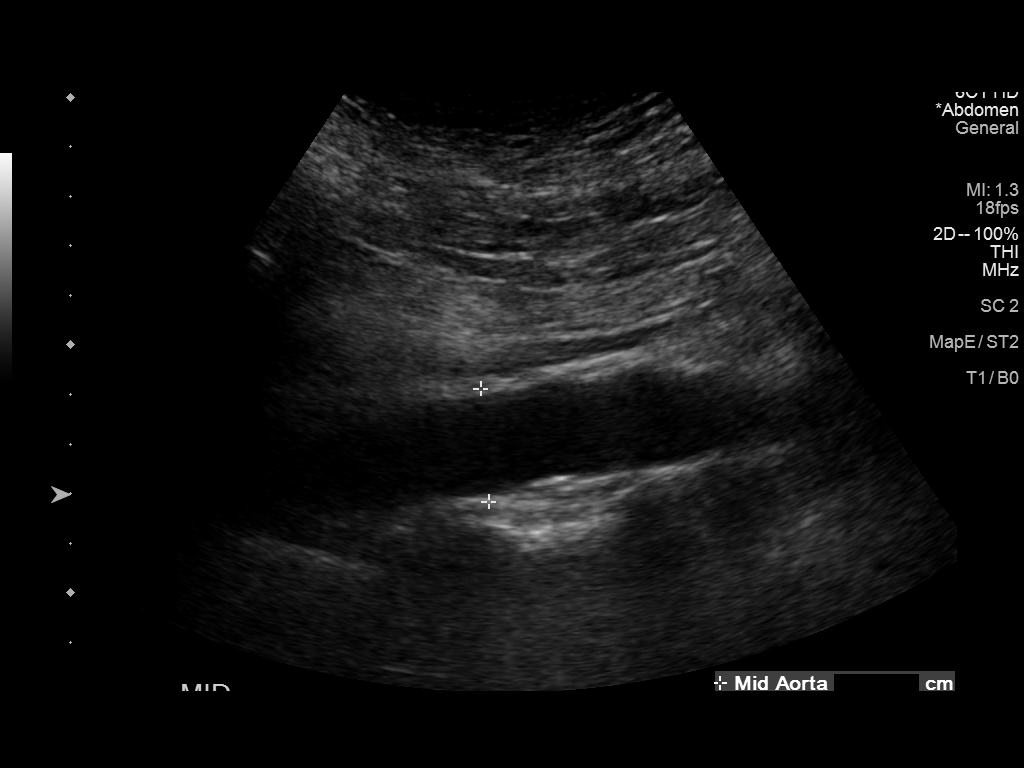
[im 4/7]
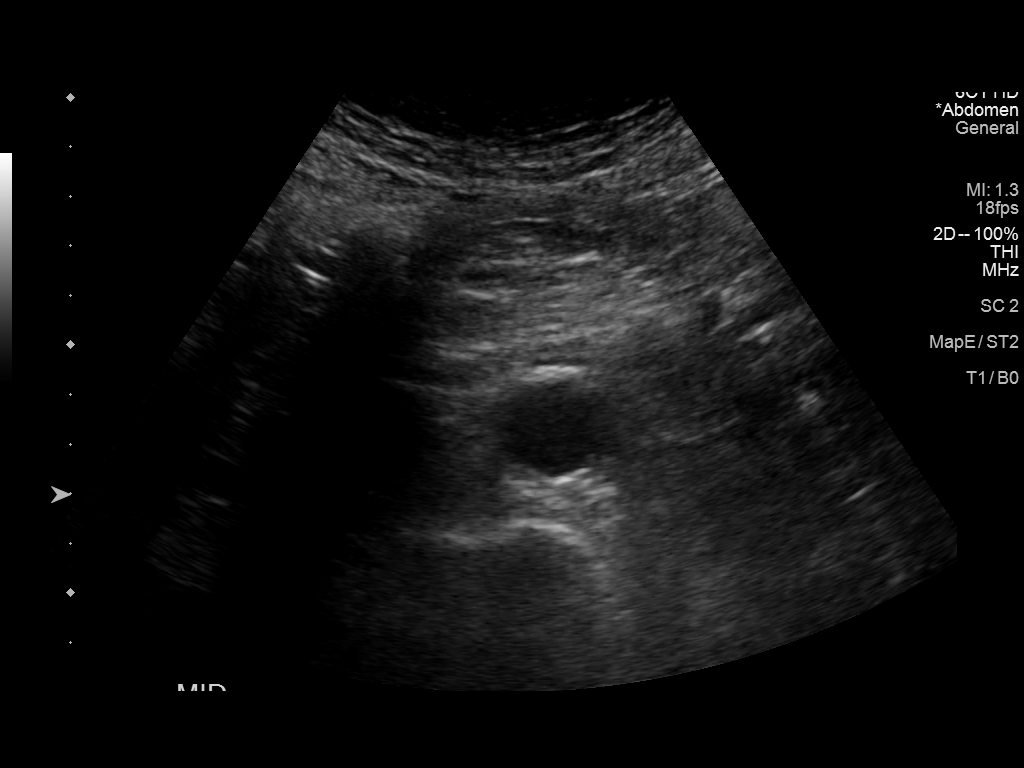
[im 5/7]
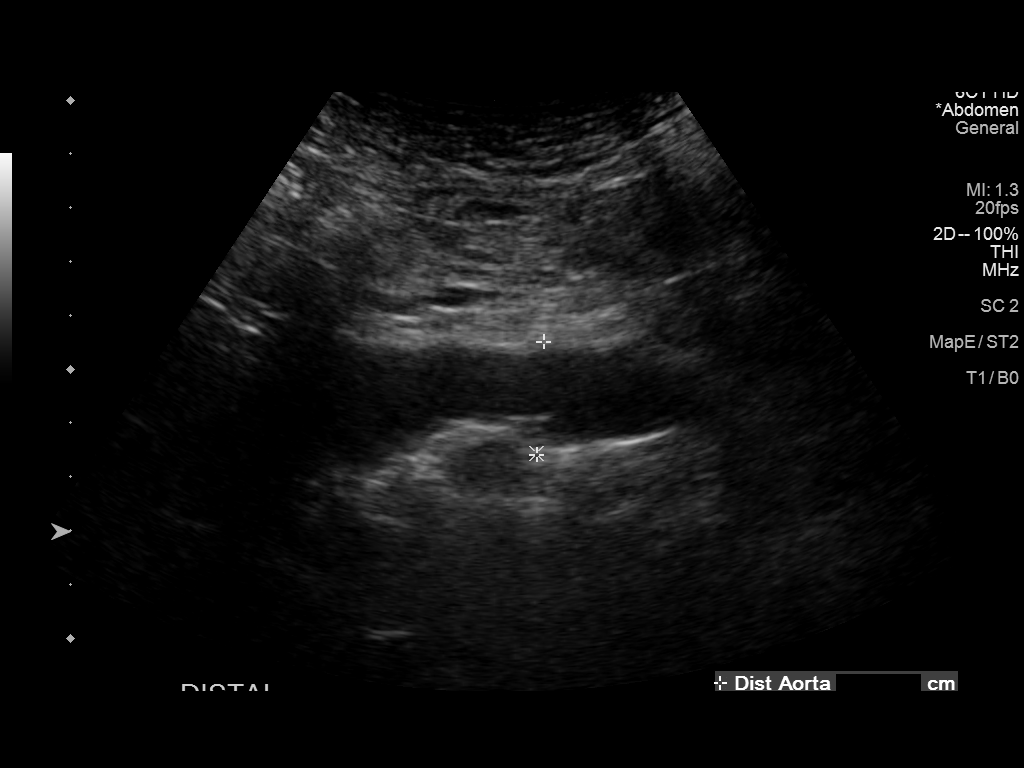
[im 6/7]
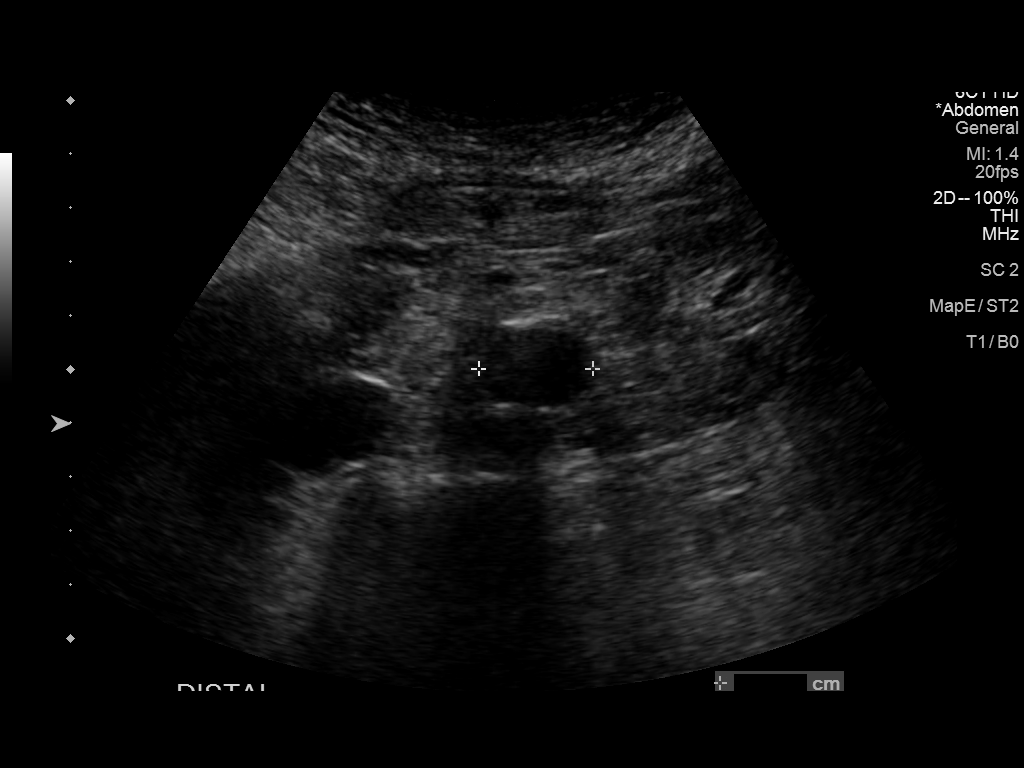
[im 7/7]
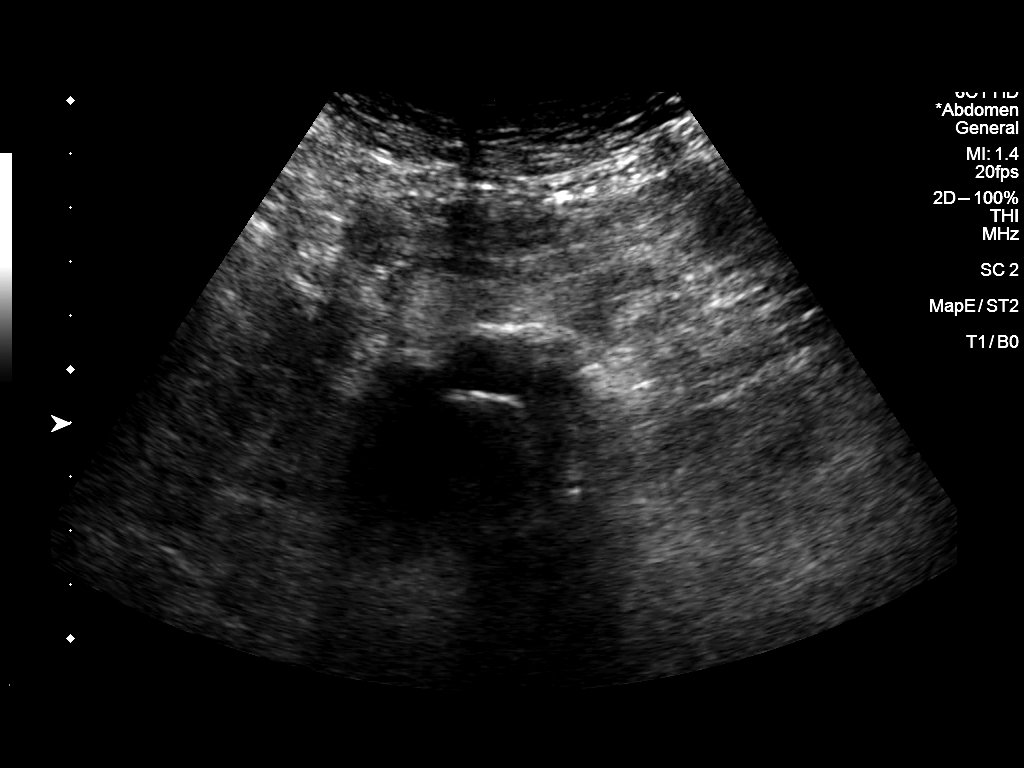

[7 of 7 positions shown; findings below may reference images not displayed]

FINDINGS: Abdominal aortic measurements as follows:

Proximal: Obscured by bowel gas.

Mid:  2.3 x 2.3 cm

Distal:  2.1 x 2.1 cm
IMPRESSION: Negative screening ultrasound examination for abdominal aortic
aneurysm.

## 2021-09-02 DIAGNOSIS — R051 Acute cough: Secondary | ICD-10-CM | POA: Diagnosis not present

## 2021-09-02 DIAGNOSIS — Z20822 Contact with and (suspected) exposure to covid-19: Secondary | ICD-10-CM | POA: Diagnosis not present

## 2021-09-02 DIAGNOSIS — R0981 Nasal congestion: Secondary | ICD-10-CM | POA: Diagnosis not present

## 2021-11-05 DIAGNOSIS — Z20822 Contact with and (suspected) exposure to covid-19: Secondary | ICD-10-CM | POA: Diagnosis not present

## 2021-11-30 DIAGNOSIS — Z20822 Contact with and (suspected) exposure to covid-19: Secondary | ICD-10-CM | POA: Diagnosis not present

## 2021-12-14 DIAGNOSIS — D225 Melanocytic nevi of trunk: Secondary | ICD-10-CM | POA: Diagnosis not present

## 2021-12-14 DIAGNOSIS — D1801 Hemangioma of skin and subcutaneous tissue: Secondary | ICD-10-CM | POA: Diagnosis not present

## 2021-12-14 DIAGNOSIS — L565 Disseminated superficial actinic porokeratosis (DSAP): Secondary | ICD-10-CM | POA: Diagnosis not present

## 2021-12-14 DIAGNOSIS — L304 Erythema intertrigo: Secondary | ICD-10-CM | POA: Diagnosis not present

## 2021-12-14 DIAGNOSIS — Z85828 Personal history of other malignant neoplasm of skin: Secondary | ICD-10-CM | POA: Diagnosis not present

## 2021-12-14 DIAGNOSIS — L821 Other seborrheic keratosis: Secondary | ICD-10-CM | POA: Diagnosis not present

## 2021-12-14 DIAGNOSIS — L570 Actinic keratosis: Secondary | ICD-10-CM | POA: Diagnosis not present

## 2021-12-14 DIAGNOSIS — Z85820 Personal history of malignant melanoma of skin: Secondary | ICD-10-CM | POA: Diagnosis not present

## 2022-03-10 DIAGNOSIS — N401 Enlarged prostate with lower urinary tract symptoms: Secondary | ICD-10-CM | POA: Diagnosis not present

## 2022-03-10 DIAGNOSIS — R972 Elevated prostate specific antigen [PSA]: Secondary | ICD-10-CM | POA: Diagnosis not present

## 2022-03-10 DIAGNOSIS — N281 Cyst of kidney, acquired: Secondary | ICD-10-CM | POA: Diagnosis not present

## 2022-03-10 DIAGNOSIS — N138 Other obstructive and reflux uropathy: Secondary | ICD-10-CM | POA: Diagnosis not present

## 2022-04-11 DIAGNOSIS — H2513 Age-related nuclear cataract, bilateral: Secondary | ICD-10-CM | POA: Diagnosis not present

## 2022-05-25 DIAGNOSIS — I10 Essential (primary) hypertension: Secondary | ICD-10-CM | POA: Diagnosis not present

## 2022-05-25 DIAGNOSIS — R7301 Impaired fasting glucose: Secondary | ICD-10-CM | POA: Diagnosis not present

## 2022-05-25 DIAGNOSIS — Z6825 Body mass index (BMI) 25.0-25.9, adult: Secondary | ICD-10-CM | POA: Diagnosis not present

## 2022-05-25 DIAGNOSIS — E782 Mixed hyperlipidemia: Secondary | ICD-10-CM | POA: Diagnosis not present

## 2022-05-25 DIAGNOSIS — M8580 Other specified disorders of bone density and structure, unspecified site: Secondary | ICD-10-CM | POA: Diagnosis not present

## 2022-05-25 DIAGNOSIS — Z0000 Encounter for general adult medical examination without abnormal findings: Secondary | ICD-10-CM | POA: Diagnosis not present

## 2022-05-25 DIAGNOSIS — Z23 Encounter for immunization: Secondary | ICD-10-CM | POA: Diagnosis not present

## 2022-06-06 DIAGNOSIS — I493 Ventricular premature depolarization: Secondary | ICD-10-CM | POA: Diagnosis not present

## 2022-06-13 DIAGNOSIS — R399 Unspecified symptoms and signs involving the genitourinary system: Secondary | ICD-10-CM | POA: Diagnosis not present

## 2022-06-15 DIAGNOSIS — L821 Other seborrheic keratosis: Secondary | ICD-10-CM | POA: Diagnosis not present

## 2022-06-15 DIAGNOSIS — L570 Actinic keratosis: Secondary | ICD-10-CM | POA: Diagnosis not present

## 2022-06-15 DIAGNOSIS — Z85828 Personal history of other malignant neoplasm of skin: Secondary | ICD-10-CM | POA: Diagnosis not present

## 2022-06-15 DIAGNOSIS — D225 Melanocytic nevi of trunk: Secondary | ICD-10-CM | POA: Diagnosis not present

## 2022-06-15 DIAGNOSIS — D1801 Hemangioma of skin and subcutaneous tissue: Secondary | ICD-10-CM | POA: Diagnosis not present

## 2022-06-15 DIAGNOSIS — D2262 Melanocytic nevi of left upper limb, including shoulder: Secondary | ICD-10-CM | POA: Diagnosis not present

## 2022-06-15 DIAGNOSIS — D2261 Melanocytic nevi of right upper limb, including shoulder: Secondary | ICD-10-CM | POA: Diagnosis not present

## 2022-06-15 DIAGNOSIS — Z85820 Personal history of malignant melanoma of skin: Secondary | ICD-10-CM | POA: Diagnosis not present

## 2022-06-15 DIAGNOSIS — L905 Scar conditions and fibrosis of skin: Secondary | ICD-10-CM | POA: Diagnosis not present

## 2022-09-29 DIAGNOSIS — N138 Other obstructive and reflux uropathy: Secondary | ICD-10-CM | POA: Diagnosis not present

## 2022-09-29 DIAGNOSIS — R972 Elevated prostate specific antigen [PSA]: Secondary | ICD-10-CM | POA: Diagnosis not present

## 2022-09-29 DIAGNOSIS — N281 Cyst of kidney, acquired: Secondary | ICD-10-CM | POA: Diagnosis not present

## 2022-09-29 DIAGNOSIS — N401 Enlarged prostate with lower urinary tract symptoms: Secondary | ICD-10-CM | POA: Diagnosis not present

## 2022-12-15 DIAGNOSIS — D225 Melanocytic nevi of trunk: Secondary | ICD-10-CM | POA: Diagnosis not present

## 2022-12-15 DIAGNOSIS — Z85828 Personal history of other malignant neoplasm of skin: Secondary | ICD-10-CM | POA: Diagnosis not present

## 2022-12-15 DIAGNOSIS — D1801 Hemangioma of skin and subcutaneous tissue: Secondary | ICD-10-CM | POA: Diagnosis not present

## 2022-12-15 DIAGNOSIS — L821 Other seborrheic keratosis: Secondary | ICD-10-CM | POA: Diagnosis not present

## 2022-12-15 DIAGNOSIS — Z85820 Personal history of malignant melanoma of skin: Secondary | ICD-10-CM | POA: Diagnosis not present

## 2022-12-15 DIAGNOSIS — L400 Psoriasis vulgaris: Secondary | ICD-10-CM | POA: Diagnosis not present

## 2022-12-15 DIAGNOSIS — L570 Actinic keratosis: Secondary | ICD-10-CM | POA: Diagnosis not present

## 2023-01-10 DIAGNOSIS — H2513 Age-related nuclear cataract, bilateral: Secondary | ICD-10-CM | POA: Diagnosis not present

## 2023-04-06 DIAGNOSIS — N210 Calculus in bladder: Secondary | ICD-10-CM | POA: Diagnosis not present

## 2023-04-06 DIAGNOSIS — N281 Cyst of kidney, acquired: Secondary | ICD-10-CM | POA: Diagnosis not present

## 2023-04-20 DIAGNOSIS — N210 Calculus in bladder: Secondary | ICD-10-CM | POA: Diagnosis not present

## 2023-04-20 DIAGNOSIS — R310 Gross hematuria: Secondary | ICD-10-CM | POA: Diagnosis not present

## 2023-04-20 DIAGNOSIS — N400 Enlarged prostate without lower urinary tract symptoms: Secondary | ICD-10-CM | POA: Diagnosis not present

## 2023-04-20 DIAGNOSIS — N281 Cyst of kidney, acquired: Secondary | ICD-10-CM | POA: Diagnosis not present

## 2023-04-20 DIAGNOSIS — I700 Atherosclerosis of aorta: Secondary | ICD-10-CM | POA: Diagnosis not present

## 2023-04-20 DIAGNOSIS — N200 Calculus of kidney: Secondary | ICD-10-CM | POA: Diagnosis not present

## 2023-04-20 DIAGNOSIS — N323 Diverticulum of bladder: Secondary | ICD-10-CM | POA: Diagnosis not present

## 2023-05-02 DIAGNOSIS — N210 Calculus in bladder: Secondary | ICD-10-CM | POA: Diagnosis not present

## 2023-05-02 DIAGNOSIS — Z79899 Other long term (current) drug therapy: Secondary | ICD-10-CM | POA: Diagnosis not present

## 2023-05-02 DIAGNOSIS — R310 Gross hematuria: Secondary | ICD-10-CM | POA: Diagnosis not present

## 2023-05-03 DIAGNOSIS — Z79899 Other long term (current) drug therapy: Secondary | ICD-10-CM | POA: Diagnosis not present

## 2023-05-03 DIAGNOSIS — N210 Calculus in bladder: Secondary | ICD-10-CM | POA: Diagnosis not present

## 2023-06-06 DIAGNOSIS — I491 Atrial premature depolarization: Secondary | ICD-10-CM | POA: Diagnosis not present

## 2023-06-06 DIAGNOSIS — R9431 Abnormal electrocardiogram [ECG] [EKG]: Secondary | ICD-10-CM | POA: Diagnosis not present

## 2023-06-06 DIAGNOSIS — I4729 Other ventricular tachycardia: Secondary | ICD-10-CM | POA: Diagnosis not present

## 2023-06-06 DIAGNOSIS — I493 Ventricular premature depolarization: Secondary | ICD-10-CM | POA: Diagnosis not present

## 2023-06-07 DIAGNOSIS — Z0000 Encounter for general adult medical examination without abnormal findings: Secondary | ICD-10-CM | POA: Diagnosis not present

## 2023-06-07 DIAGNOSIS — J3089 Other allergic rhinitis: Secondary | ICD-10-CM | POA: Diagnosis not present

## 2023-06-07 DIAGNOSIS — R7301 Impaired fasting glucose: Secondary | ICD-10-CM | POA: Diagnosis not present

## 2023-06-07 DIAGNOSIS — Z23 Encounter for immunization: Secondary | ICD-10-CM | POA: Diagnosis not present

## 2023-06-07 DIAGNOSIS — I10 Essential (primary) hypertension: Secondary | ICD-10-CM | POA: Diagnosis not present

## 2023-06-07 DIAGNOSIS — E782 Mixed hyperlipidemia: Secondary | ICD-10-CM | POA: Diagnosis not present

## 2023-06-07 DIAGNOSIS — M8580 Other specified disorders of bone density and structure, unspecified site: Secondary | ICD-10-CM | POA: Diagnosis not present

## 2023-06-09 DIAGNOSIS — I491 Atrial premature depolarization: Secondary | ICD-10-CM | POA: Diagnosis not present

## 2023-06-20 DIAGNOSIS — L820 Inflamed seborrheic keratosis: Secondary | ICD-10-CM | POA: Diagnosis not present

## 2023-06-20 DIAGNOSIS — Z85820 Personal history of malignant melanoma of skin: Secondary | ICD-10-CM | POA: Diagnosis not present

## 2023-06-20 DIAGNOSIS — Z85828 Personal history of other malignant neoplasm of skin: Secondary | ICD-10-CM | POA: Diagnosis not present

## 2023-06-20 DIAGNOSIS — D225 Melanocytic nevi of trunk: Secondary | ICD-10-CM | POA: Diagnosis not present

## 2023-06-20 DIAGNOSIS — L570 Actinic keratosis: Secondary | ICD-10-CM | POA: Diagnosis not present

## 2023-06-28 DIAGNOSIS — I340 Nonrheumatic mitral (valve) insufficiency: Secondary | ICD-10-CM | POA: Diagnosis not present

## 2023-06-28 DIAGNOSIS — I517 Cardiomegaly: Secondary | ICD-10-CM | POA: Diagnosis not present

## 2023-06-28 DIAGNOSIS — I272 Pulmonary hypertension, unspecified: Secondary | ICD-10-CM | POA: Diagnosis not present

## 2023-10-23 DIAGNOSIS — H2513 Age-related nuclear cataract, bilateral: Secondary | ICD-10-CM | POA: Diagnosis not present

## 2023-11-17 DIAGNOSIS — I10 Essential (primary) hypertension: Secondary | ICD-10-CM | POA: Diagnosis not present

## 2023-11-27 DIAGNOSIS — E782 Mixed hyperlipidemia: Secondary | ICD-10-CM | POA: Diagnosis not present

## 2023-11-27 DIAGNOSIS — I10 Essential (primary) hypertension: Secondary | ICD-10-CM | POA: Diagnosis not present

## 2023-12-16 DIAGNOSIS — I10 Essential (primary) hypertension: Secondary | ICD-10-CM | POA: Diagnosis not present

## 2023-12-19 DIAGNOSIS — L814 Other melanin hyperpigmentation: Secondary | ICD-10-CM | POA: Diagnosis not present

## 2023-12-19 DIAGNOSIS — L905 Scar conditions and fibrosis of skin: Secondary | ICD-10-CM | POA: Diagnosis not present

## 2023-12-19 DIAGNOSIS — Z85828 Personal history of other malignant neoplasm of skin: Secondary | ICD-10-CM | POA: Diagnosis not present

## 2023-12-19 DIAGNOSIS — D2261 Melanocytic nevi of right upper limb, including shoulder: Secondary | ICD-10-CM | POA: Diagnosis not present

## 2023-12-19 DIAGNOSIS — L821 Other seborrheic keratosis: Secondary | ICD-10-CM | POA: Diagnosis not present

## 2023-12-19 DIAGNOSIS — L570 Actinic keratosis: Secondary | ICD-10-CM | POA: Diagnosis not present

## 2023-12-19 DIAGNOSIS — Z85820 Personal history of malignant melanoma of skin: Secondary | ICD-10-CM | POA: Diagnosis not present

## 2023-12-19 DIAGNOSIS — D225 Melanocytic nevi of trunk: Secondary | ICD-10-CM | POA: Diagnosis not present

## 2023-12-27 DIAGNOSIS — E782 Mixed hyperlipidemia: Secondary | ICD-10-CM | POA: Diagnosis not present

## 2023-12-27 DIAGNOSIS — I10 Essential (primary) hypertension: Secondary | ICD-10-CM | POA: Diagnosis not present

## 2024-01-15 DIAGNOSIS — I10 Essential (primary) hypertension: Secondary | ICD-10-CM | POA: Diagnosis not present

## 2024-01-27 DIAGNOSIS — E782 Mixed hyperlipidemia: Secondary | ICD-10-CM | POA: Diagnosis not present

## 2024-01-27 DIAGNOSIS — I10 Essential (primary) hypertension: Secondary | ICD-10-CM | POA: Diagnosis not present

## 2024-02-14 DIAGNOSIS — I10 Essential (primary) hypertension: Secondary | ICD-10-CM | POA: Diagnosis not present

## 2024-03-15 DIAGNOSIS — I10 Essential (primary) hypertension: Secondary | ICD-10-CM | POA: Diagnosis not present

## 2024-03-28 DIAGNOSIS — I10 Essential (primary) hypertension: Secondary | ICD-10-CM | POA: Diagnosis not present

## 2024-03-28 DIAGNOSIS — E782 Mixed hyperlipidemia: Secondary | ICD-10-CM | POA: Diagnosis not present

## 2024-04-16 DIAGNOSIS — I10 Essential (primary) hypertension: Secondary | ICD-10-CM | POA: Diagnosis not present

## 2024-04-28 DIAGNOSIS — E782 Mixed hyperlipidemia: Secondary | ICD-10-CM | POA: Diagnosis not present

## 2024-04-28 DIAGNOSIS — I10 Essential (primary) hypertension: Secondary | ICD-10-CM | POA: Diagnosis not present

## 2024-05-16 DIAGNOSIS — I10 Essential (primary) hypertension: Secondary | ICD-10-CM | POA: Diagnosis not present

## 2024-05-28 DIAGNOSIS — I10 Essential (primary) hypertension: Secondary | ICD-10-CM | POA: Diagnosis not present

## 2024-05-28 DIAGNOSIS — E782 Mixed hyperlipidemia: Secondary | ICD-10-CM | POA: Diagnosis not present

## 2024-06-13 DIAGNOSIS — Z23 Encounter for immunization: Secondary | ICD-10-CM | POA: Diagnosis not present

## 2024-06-13 DIAGNOSIS — R972 Elevated prostate specific antigen [PSA]: Secondary | ICD-10-CM | POA: Diagnosis not present

## 2024-06-13 DIAGNOSIS — E782 Mixed hyperlipidemia: Secondary | ICD-10-CM | POA: Diagnosis not present

## 2024-06-13 DIAGNOSIS — Z0000 Encounter for general adult medical examination without abnormal findings: Secondary | ICD-10-CM | POA: Diagnosis not present

## 2024-06-13 DIAGNOSIS — Z6825 Body mass index (BMI) 25.0-25.9, adult: Secondary | ICD-10-CM | POA: Diagnosis not present

## 2024-06-13 DIAGNOSIS — R7303 Prediabetes: Secondary | ICD-10-CM | POA: Diagnosis not present

## 2024-06-13 DIAGNOSIS — M8580 Other specified disorders of bone density and structure, unspecified site: Secondary | ICD-10-CM | POA: Diagnosis not present

## 2024-06-13 DIAGNOSIS — I10 Essential (primary) hypertension: Secondary | ICD-10-CM | POA: Diagnosis not present

## 2024-06-13 DIAGNOSIS — L409 Psoriasis, unspecified: Secondary | ICD-10-CM | POA: Diagnosis not present

## 2024-06-15 DIAGNOSIS — I10 Essential (primary) hypertension: Secondary | ICD-10-CM | POA: Diagnosis not present

## 2024-06-19 DIAGNOSIS — L821 Other seborrheic keratosis: Secondary | ICD-10-CM | POA: Diagnosis not present

## 2024-06-19 DIAGNOSIS — L905 Scar conditions and fibrosis of skin: Secondary | ICD-10-CM | POA: Diagnosis not present

## 2024-06-19 DIAGNOSIS — D485 Neoplasm of uncertain behavior of skin: Secondary | ICD-10-CM | POA: Diagnosis not present

## 2024-06-19 DIAGNOSIS — L570 Actinic keratosis: Secondary | ICD-10-CM | POA: Diagnosis not present

## 2024-06-19 DIAGNOSIS — Z85828 Personal history of other malignant neoplasm of skin: Secondary | ICD-10-CM | POA: Diagnosis not present

## 2024-06-19 DIAGNOSIS — Z85820 Personal history of malignant melanoma of skin: Secondary | ICD-10-CM | POA: Diagnosis not present

## 2024-06-19 DIAGNOSIS — D225 Melanocytic nevi of trunk: Secondary | ICD-10-CM | POA: Diagnosis not present

## 2024-06-25 DIAGNOSIS — I493 Ventricular premature depolarization: Secondary | ICD-10-CM | POA: Diagnosis not present

## 2024-06-25 DIAGNOSIS — I4729 Other ventricular tachycardia: Secondary | ICD-10-CM | POA: Diagnosis not present

## 2024-06-25 DIAGNOSIS — I491 Atrial premature depolarization: Secondary | ICD-10-CM | POA: Diagnosis not present

## 2024-06-26 DIAGNOSIS — I498 Other specified cardiac arrhythmias: Secondary | ICD-10-CM | POA: Diagnosis not present

## 2024-06-26 DIAGNOSIS — I493 Ventricular premature depolarization: Secondary | ICD-10-CM | POA: Diagnosis not present

## 2024-06-28 DIAGNOSIS — E782 Mixed hyperlipidemia: Secondary | ICD-10-CM | POA: Diagnosis not present

## 2024-06-28 DIAGNOSIS — I10 Essential (primary) hypertension: Secondary | ICD-10-CM | POA: Diagnosis not present

## 2024-07-01 DIAGNOSIS — H2513 Age-related nuclear cataract, bilateral: Secondary | ICD-10-CM | POA: Diagnosis not present

## 2024-07-15 DIAGNOSIS — I10 Essential (primary) hypertension: Secondary | ICD-10-CM | POA: Diagnosis not present

## 2024-07-28 DIAGNOSIS — I10 Essential (primary) hypertension: Secondary | ICD-10-CM | POA: Diagnosis not present

## 2024-07-28 DIAGNOSIS — E782 Mixed hyperlipidemia: Secondary | ICD-10-CM | POA: Diagnosis not present

## 2024-08-01 DIAGNOSIS — N401 Enlarged prostate with lower urinary tract symptoms: Secondary | ICD-10-CM | POA: Diagnosis not present

## 2024-08-01 DIAGNOSIS — N281 Cyst of kidney, acquired: Secondary | ICD-10-CM | POA: Diagnosis not present

## 2024-08-01 DIAGNOSIS — N138 Other obstructive and reflux uropathy: Secondary | ICD-10-CM | POA: Diagnosis not present

## 2024-08-01 DIAGNOSIS — R972 Elevated prostate specific antigen [PSA]: Secondary | ICD-10-CM | POA: Diagnosis not present
